# Patient Record
Sex: Female | Born: 1960 | Race: White | Hispanic: No | Marital: Married | Smoking: Never smoker
Health system: Southern US, Community
[De-identification: ages and names within clinical notes are randomized; demographics above are authoritative.]

## PROBLEM LIST (undated history)

## (undated) DIAGNOSIS — E0501 Thyrotoxicosis with diffuse goiter with thyrotoxic crisis or storm: Secondary | ICD-10-CM

## (undated) DIAGNOSIS — K219 Gastro-esophageal reflux disease without esophagitis: Secondary | ICD-10-CM

## (undated) DIAGNOSIS — I519 Heart disease, unspecified: Secondary | ICD-10-CM

## (undated) DIAGNOSIS — I499 Cardiac arrhythmia, unspecified: Secondary | ICD-10-CM

## (undated) DIAGNOSIS — E0500 Thyrotoxicosis with diffuse goiter without thyrotoxic crisis or storm: Secondary | ICD-10-CM

## (undated) DIAGNOSIS — I4819 Other persistent atrial fibrillation: Secondary | ICD-10-CM

## (undated) DIAGNOSIS — E039 Hypothyroidism, unspecified: Secondary | ICD-10-CM

## (undated) DIAGNOSIS — Z8679 Personal history of other diseases of the circulatory system: Secondary | ICD-10-CM

## (undated) HISTORY — DX: Hypothyroidism, unspecified: E03.9

## (undated) HISTORY — DX: Gastro-esophageal reflux disease without esophagitis: K21.9

## (undated) HISTORY — DX: Other persistent atrial fibrillation: I48.19

## (undated) HISTORY — DX: Thyrotoxicosis with diffuse goiter without thyrotoxic crisis or storm: E05.00

## (undated) HISTORY — PX: NO PAST SURGERIES: SHX2092

## (undated) HISTORY — DX: Heart disease, unspecified: I51.9

## (undated) HISTORY — DX: Personal history of other diseases of the circulatory system: Z86.79

## (undated) HISTORY — DX: Thyrotoxicosis with diffuse goiter with thyrotoxic crisis or storm: E05.01

---

## 2014-10-01 ENCOUNTER — Other Ambulatory Visit (HOSPITAL_COMMUNITY): Payer: Self-pay | Admitting: "Endocrinology

## 2014-10-01 DIAGNOSIS — E0590 Thyrotoxicosis, unspecified without thyrotoxic crisis or storm: Secondary | ICD-10-CM

## 2014-10-07 ENCOUNTER — Encounter (HOSPITAL_COMMUNITY): Payer: Self-pay

## 2014-10-07 ENCOUNTER — Encounter (HOSPITAL_COMMUNITY)
Admission: RE | Admit: 2014-10-07 | Discharge: 2014-10-07 | Disposition: A | Discharge status: Home / Self Care | Payer: BLUE CROSS/BLUE SHIELD | Source: Ambulatory Visit | Attending: "Endocrinology | Admitting: "Endocrinology

## 2014-10-07 DIAGNOSIS — E0590 Thyrotoxicosis, unspecified without thyrotoxic crisis or storm: Secondary | ICD-10-CM | POA: Insufficient documentation

## 2014-10-07 MED ORDER — SODIUM IODIDE I 131 CAPSULE
12.0000 | Freq: Once | INTRAVENOUS | Status: AC | PRN
  Administered 2014-10-07: 12 via ORAL

## 2014-10-08 ENCOUNTER — Encounter (HOSPITAL_COMMUNITY)
Admission: RE | Admit: 2014-10-08 | Discharge: 2014-10-08 | Disposition: A | Discharge status: Home / Self Care | Payer: BLUE CROSS/BLUE SHIELD | Source: Ambulatory Visit | Attending: "Endocrinology | Admitting: "Endocrinology

## 2014-10-08 DIAGNOSIS — E0590 Thyrotoxicosis, unspecified without thyrotoxic crisis or storm: Secondary | ICD-10-CM | POA: Diagnosis not present

## 2014-10-08 MED ORDER — SODIUM PERTECHNETATE TC 99M INJECTION
10.0000 | Freq: Once | INTRAVENOUS | Status: AC | PRN
  Administered 2014-10-08: 10 via INTRAVENOUS

## 2014-10-20 ENCOUNTER — Other Ambulatory Visit (HOSPITAL_COMMUNITY): Payer: Self-pay | Admitting: "Endocrinology

## 2014-10-20 DIAGNOSIS — E0590 Thyrotoxicosis, unspecified without thyrotoxic crisis or storm: Secondary | ICD-10-CM

## 2014-11-17 ENCOUNTER — Encounter (HOSPITAL_COMMUNITY): Payer: Self-pay

## 2014-11-17 ENCOUNTER — Encounter (HOSPITAL_COMMUNITY)
Admission: RE | Admit: 2014-11-17 | Discharge: 2014-11-17 | Disposition: A | Discharge status: Home / Self Care | Payer: BLUE CROSS/BLUE SHIELD | Source: Ambulatory Visit | Attending: "Endocrinology | Admitting: "Endocrinology

## 2014-11-17 DIAGNOSIS — E0590 Thyrotoxicosis, unspecified without thyrotoxic crisis or storm: Secondary | ICD-10-CM | POA: Insufficient documentation

## 2014-11-17 MED ORDER — SODIUM IODIDE I 131 CAPSULE
15.0000 | Freq: Once | INTRAVENOUS | Status: AC | PRN
  Administered 2014-11-17: 15.3 via ORAL

## 2015-02-24 ENCOUNTER — Ambulatory Visit: Payer: Self-pay | Admitting: "Endocrinology

## 2015-02-25 ENCOUNTER — Encounter: Payer: Self-pay | Admitting: "Endocrinology

## 2015-02-25 ENCOUNTER — Ambulatory Visit (INDEPENDENT_AMBULATORY_CARE_PROVIDER_SITE_OTHER): Payer: BLUE CROSS/BLUE SHIELD | Admitting: "Endocrinology

## 2015-02-25 VITALS — BP 124/85 | HR 71 | Ht 64.0 in | Wt 174.0 lb

## 2015-02-25 DIAGNOSIS — E890 Postprocedural hypothyroidism: Secondary | ICD-10-CM | POA: Insufficient documentation

## 2015-02-25 DIAGNOSIS — E032 Hypothyroidism due to medicaments and other exogenous substances: Secondary | ICD-10-CM | POA: Diagnosis not present

## 2015-02-25 MED ORDER — LEVOTHYROXINE SODIUM 100 MCG PO TABS: 100.0000 ug | ORAL_TABLET | Freq: Every day | ORAL | Status: DC

## 2015-02-25 NOTE — Progress Notes (Signed)
HPI  Bethany Blankenship is a 54 y.o.-year-old female, she is here to follow-up after treatment for Graves' disease with RAI . She is not on thyroid hormone replacement yet.  Pt describes: Weight gain, fatigue, cold intolerance, constipation, and dry skin.  Her labs on 02/17/2015 showed TSH elevated at 46 free T4 depressed at 0.4.  ROS: Constitutional: + weight gain,  +fatigue, +, hypothermia Eyes: no blurry vision, no xerophthalmia ENT: no sore throat, no nodules palpated in throat, no dysphagia/odynophagia, no hoarseness Cardiovascular: no CP/SOB/palpitations/leg swelling Respiratory: no cough/SOB Gastrointestinal: no N/V/D/, +constipation. Musculoskeletal: no muscle/joint aches Skin: no rashes Neurological: no tremors/numbness/tingling/dizziness Psychiatric: no depression/anxiety  PE: BP 124/85 mmHg  Pulse 71  Ht 5\' 4"  (1.626 m)  Wt 174 lb (78.926 kg)  BMI 29.85 kg/m2  SpO2 100% Wt Readings from Last 3 Encounters:  02/25/15 174 lb (78.926 kg)   Constitutional: overweight, in NAD Eyes: PERRLA, EOMI, no exophthalmos ENT: moist mucous membranes, no thyromegaly, no cervical lymphadenopathy Cardiovascular: RRR, No MRG Respiratory: CTA B Gastrointestinal: abdomen soft, NT, ND, BS+ Musculoskeletal: no deformities, strength intact in all 4 Skin: moist, warm, no rashes Neurological: no tremor with outstretched hands, DTR normal in all 4  ASSESSMENT: 1. 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 Her labs are consistent with profound hypothyroidism ready for initiation of thyroid hormone replacement. I would initiate levothyroxine 100 g by mouth every morning, to advance as tolerated. - We discussed about correct intake of levothyroxine, at fasting, with water, separated by at least 30 minutes from breakfast, and separated by more than 4 hours from calcium, iron, multivitamins, acid reflux medications  (PPIs). -Patient is made aware of the fact that thyroid hormone replacement is needed for life, dose to be adjusted by periodic monitoring of thyroid function tests. - Will check thyroid tests before next visit: TSH, free T4   Glade Lloyd, MD Phone: [phone]  Fax: [phone]   02/25/2015, 11:14 AM

## 2015-05-18 ENCOUNTER — Other Ambulatory Visit: Payer: Self-pay | Admitting: "Endocrinology

## 2015-05-18 LAB — TSH: TSH: 0.545 u[IU]/mL (ref 0.350–4.500)

## 2015-05-18 LAB — T4, FREE: Free T4: 1.58 ng/dL (ref 0.80–1.80)

## 2015-05-31 ENCOUNTER — Ambulatory Visit (INDEPENDENT_AMBULATORY_CARE_PROVIDER_SITE_OTHER): Payer: BLUE CROSS/BLUE SHIELD | Admitting: "Endocrinology

## 2015-05-31 VITALS — BP 121/72 | HR 54 | Ht 64.0 in | Wt 176.0 lb

## 2015-05-31 DIAGNOSIS — E032 Hypothyroidism due to medicaments and other exogenous substances: Secondary | ICD-10-CM | POA: Diagnosis not present

## 2015-05-31 MED ORDER — LEVOTHYROXINE SODIUM 100 MCG PO TABS: 100.0000 ug | ORAL_TABLET | Freq: Every day | ORAL | Status: DC

## 2015-05-31 NOTE — Progress Notes (Signed)
HPI  Bethany Blankenship is a 55 y.o.-year-old female, she is here to follow-up after treatment for Graves' disease with RAI . She is on thyroid hormone replacement.  Pt denies new complaints. He feels better.    ROS: Constitutional: - weight gain,  -fatigue, - hypothermia Eyes: no blurry vision, no xerophthalmia ENT: no sore throat, no nodules palpated in throat, no dysphagia/odynophagia, no hoarseness Cardiovascular: no CP/SOB/palpitations/leg swelling Respiratory: no cough/SOB Gastrointestinal: no N/V/D/, +constipation. Musculoskeletal: no muscle/joint aches Skin: no rashes Neurological: no tremors/numbness/tingling/dizziness Psychiatric: no depression/anxiety  PE: BP 121/72 mmHg  Pulse 54  Ht 5\' 4"  (1.626 m)  Wt 176 lb (79.833 kg)  BMI 30.20 kg/m2  SpO2 98% Wt Readings from Last 3 Encounters:  05/31/15 176 lb (79.833 kg)  02/25/15 174 lb (78.926 kg)   Constitutional: overweight, in NAD Eyes: PERRLA, EOMI, no exophthalmos ENT: moist mucous membranes, no thyromegaly, no cervical lymphadenopathy Cardiovascular: RRR, No MRG Respiratory: CTA B Gastrointestinal: abdomen soft, NT, ND, BS+ Musculoskeletal: no deformities, strength intact in all 4 Skin: moist, warm, no rashes Neurological: no tremor with outstretched hands, DTR normal in all 4  ASSESSMENT: 1. 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 Her labs are consistent with  appropriate replacement with thyroid hormone. I will continue with levothyroxine 100 g by mouth every morning.  - We discussed about correct intake of levothyroxine, at fasting, with water, separated by at least 30 minutes from breakfast, and separated by more than 4 hours from calcium, iron, multivitamins, acid reflux medications (PPIs). -Patient is made aware of the fact that thyroid hormone replacement is needed for life, dose to be adjusted by periodic monitoring of  thyroid function tests. - Will check thyroid tests before next visit: TSH, free T4   Glade Lloyd, MD Phone: [phone]  Fax: [phone]   05/31/2015, 3:47 PM

## 2015-10-29 IMAGING — NM NM THYROID IMAGING W/ UPTAKE SINGLE (24 HR)
4 series · 4 of 4 positions shown · non-contrast
Comparison: None

CLINICAL DATA: Symptoms of hyperthyroidism.

EXAM:
THYROID SCAN AND UPTAKE - 24 HOURS
TECHNIQUE: Following the per oral administration of Q-I9I sodium iodide, the
patient returned at 24 hours and uptake measurements were acquired
with the uptake probe centered on the neck. Thyroid imaging was
performed following the intravenous administration of the Dc-55m
Pertechnetate.
RADIOPHARMACEUTICALS:  12.0 microCuries Q-I9I sodium iodide orally
and 10.0 mCi 0echnetium-UUm pertechnetate IV

[Series 1: ant w marker · 3.25mm/px · 1 of 1 slices shown]
[im 1/1]
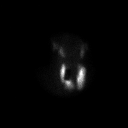

[Series 2: anterior · 3.25mm/px · 1 of 1 slices shown]
[im 1/1]
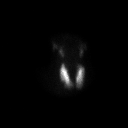

[Series 3: lao · 3.25mm/px · 1 of 1 slices shown]
[im 1/1]
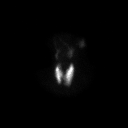

[Series 4: rao · 3.25mm/px · 1 of 1 slices shown]
[im 1/1]
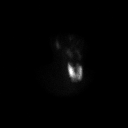

[4 of 4 positions shown; findings below may reference images not displayed]

FINDINGS: The thyroid scan shows diffuse homogeneous uptake throughout both
lobes of the thyroid gland. No dominant hot or cold nodules
identified. The 24 hr radioactive iodine uptake is equal to 33%.
IMPRESSION: 1. Mildly elevated 24 hr radioactive iodine uptake equal to 33%.
2. Normal thyroid scan. In a patient who is clinically hyperthyroid
findings may reflect early Grave's disease.

## 2015-12-01 ENCOUNTER — Other Ambulatory Visit: Payer: Self-pay | Admitting: "Endocrinology

## 2015-12-01 LAB — TSH: TSH: 3.08 m[IU]/L

## 2015-12-01 LAB — T4, FREE: Free T4: 1.8 ng/dL (ref 0.8–1.8)

## 2015-12-02 ENCOUNTER — Ambulatory Visit: Payer: Self-pay | Admitting: "Endocrinology

## 2015-12-08 ENCOUNTER — Ambulatory Visit (INDEPENDENT_AMBULATORY_CARE_PROVIDER_SITE_OTHER): Payer: BLUE CROSS/BLUE SHIELD | Admitting: "Endocrinology

## 2015-12-08 ENCOUNTER — Encounter: Payer: Self-pay | Admitting: "Endocrinology

## 2015-12-08 VITALS — BP 116/78 | HR 78 | Resp 18 | Ht 64.0 in | Wt 178.0 lb

## 2015-12-08 DIAGNOSIS — E890 Postprocedural hypothyroidism: Secondary | ICD-10-CM | POA: Diagnosis not present

## 2015-12-08 MED ORDER — LEVOTHYROXINE SODIUM 100 MCG PO TABS: 100.0000 ug | ORAL_TABLET | Freq: Every day | ORAL | 6 refills | Status: DC

## 2015-12-08 NOTE — Progress Notes (Signed)
HPI  Bethany Blankenship is a 55 y.o.-year-old female, she is here to follow-up after treatment for Graves' disease with RAI . She is on thyroid hormone replacement.  Bethany Blankenship denies new complaints. He feels better.    ROS: Constitutional: - weight gain,  -fatigue, - hypothermia Eyes: no blurry vision, no xerophthalmia ENT: no sore throat, no nodules palpated in throat, no dysphagia/odynophagia, no hoarseness Cardiovascular: no CP/SOB/palpitations/leg swelling Respiratory: no cough/SOB Gastrointestinal: no N/V/D/, +constipation. Musculoskeletal: no muscle/joint aches Skin: no rashes Neurological: no tremors/numbness/tingling/dizziness Psychiatric: no depression/anxiety  PE: BP 116/78 (BP Location: Right Arm, Patient Position: Sitting, Cuff Size: Normal)   Pulse 78   Resp 18   Ht 5\' 4"  (1.626 m)   Wt 178 lb (80.7 kg)   SpO2 100%   BMI 30.55 kg/m  Wt Readings from Last 3 Encounters:  12/08/15 178 lb (80.7 kg)  05/31/15 176 lb (79.8 kg)  02/25/15 174 lb (78.9 kg)   Constitutional: overweight, in NAD Eyes: PERRLA, EOMI, no exophthalmos ENT: moist mucous membranes, no thyromegaly, no cervical lymphadenopathy Cardiovascular: RRR, No MRG Respiratory: CTA B Gastrointestinal: abdomen soft, NT, ND, BS+ Musculoskeletal: no deformities, strength intact in all 4 Skin: moist, warm, no rashes Neurological: no tremor with outstretched hands, DTR normal in all 4  ASSESSMENT: 1. 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 I will continue with levothyroxine 100 g by mouth every morning.  - We discussed about correct intake of levothyroxine, at fasting, with water, separated by at least 30 minutes from breakfast, and separated by more than 4 hours from calcium, iron, multivitamins, acid reflux medications (PPIs). -Patient is made aware of the fact that thyroid hormone replacement is needed for life, dose to be adjusted by periodic  monitoring of thyroid function tests. - Will check thyroid tests before next visit: TSH, free T4   Glade Lloyd, MD Phone: [phone]  Fax: [phone]   12/08/2015, 10:36 AM

## 2016-06-14 ENCOUNTER — Other Ambulatory Visit: Payer: Self-pay | Admitting: "Endocrinology

## 2016-06-14 ENCOUNTER — Ambulatory Visit: Payer: Self-pay | Admitting: "Endocrinology

## 2016-06-14 DIAGNOSIS — E039 Hypothyroidism, unspecified: Secondary | ICD-10-CM

## 2016-06-15 LAB — TSH: TSH: 4.65 mIU/L — ABNORMAL HIGH

## 2016-06-15 LAB — T4, FREE: FREE T4: 1.5 ng/dL (ref 0.8–1.8)

## 2016-06-23 ENCOUNTER — Encounter: Payer: Self-pay | Admitting: "Endocrinology

## 2016-06-23 ENCOUNTER — Ambulatory Visit (INDEPENDENT_AMBULATORY_CARE_PROVIDER_SITE_OTHER): Payer: BLUE CROSS/BLUE SHIELD | Admitting: "Endocrinology

## 2016-06-23 VITALS — BP 103/73 | HR 92 | Ht 64.0 in | Wt 176.0 lb

## 2016-06-23 DIAGNOSIS — E890 Postprocedural hypothyroidism: Secondary | ICD-10-CM | POA: Diagnosis not present

## 2016-06-23 MED ORDER — LEVOTHYROXINE SODIUM 112 MCG PO TABS: 112.0000 ug | ORAL_TABLET | Freq: Every day | ORAL | 6 refills | Status: DC

## 2016-06-23 NOTE — Progress Notes (Signed)
HPI  Bethany Blankenship is a 56 y.o.-year-old female, she is here to follow-up after treatment for Graves' disease with RAI . She is on thyroid hormone replacement.  Pt reports cold intolerance and fatigue.   ROS: Constitutional: - weight gain,  -fatigue, - hypothermia Eyes: no blurry vision, no xerophthalmia ENT: no sore throat, no nodules palpated in throat, no dysphagia/odynophagia, no hoarseness Cardiovascular: no CP/SOB/palpitations/leg swelling Respiratory: no cough/SOB Gastrointestinal: no N/V/D/, +constipation. Musculoskeletal: no muscle/joint aches Skin: no rashes Neurological: no tremors/numbness/tingling/dizziness Psychiatric: no depression/anxiety  PE: BP 103/73   Pulse 92   Ht 5\' 4"  (1.626 m)   Wt 176 lb (79.8 kg)   BMI 30.21 kg/m  Wt Readings from Last 3 Encounters:  06/23/16 176 lb (79.8 kg)  12/08/15 178 lb (80.7 kg)  05/31/15 176 lb (79.8 kg)   Constitutional: overweight, in NAD Eyes: PERRLA, EOMI, no exophthalmos ENT: moist mucous membranes, no thyromegaly, no cervical lymphadenopathy Cardiovascular: RRR, No MRG Respiratory: CTA B Gastrointestinal: abdomen soft, NT, ND, BS+ Musculoskeletal: no deformities, strength intact in all 4 Skin: moist, warm, no rashes Neurological: no tremor with outstretched hands, DTR normal in all 4   Recent Results (from the past 2160 hour(s))  TSH     Status: Abnormal   Collection Time: 06/14/16 11:00 AM  Result Value Ref Range   TSH 4.65 (H) mIU/L    Comment:   Reference Range   > or = 20 Years  0.40-4.50   Pregnancy Range First trimester  0.26-2.66 Second trimester 0.55-2.73 Third trimester  0.43-2.91     T4, Free     Status: None   Collection Time: 06/14/16 12:35 PM  Result Value Ref Range   Free T4 1.5 0.8 - 1.8 ng/dL    ASSESSMENT: 1. 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 I will Increase levothyroxine  to 112 g by mouth every morning.   - We discussed about correct intake of levothyroxine, at fasting, with water, separated by at least 30 minutes from breakfast, and separated by more than 4 hours from calcium, iron, multivitamins, acid reflux medications (PPIs). -Patient is made aware of the fact that thyroid hormone replacement is needed for life, dose to be adjusted by periodic monitoring of thyroid function tests. - Will check thyroid tests before next visit: TSH, free T4   Glade Lloyd, MD Phone: [phone]  Fax: [phone]   06/23/2016, 10:01 AM

## 2016-07-09 ENCOUNTER — Other Ambulatory Visit: Payer: Self-pay | Admitting: "Endocrinology

## 2016-07-10 MED ORDER — LEVOTHYROXINE SODIUM 112 MCG PO TABS: 112.0000 ug | ORAL_TABLET | Freq: Every day | ORAL | 6 refills | Status: DC

## 2016-09-01 ENCOUNTER — Encounter: Payer: Self-pay | Admitting: *Deleted

## 2016-09-01 ENCOUNTER — Encounter: Payer: Self-pay | Admitting: Cardiovascular Disease

## 2016-09-01 ENCOUNTER — Ambulatory Visit (INDEPENDENT_AMBULATORY_CARE_PROVIDER_SITE_OTHER): Payer: BLUE CROSS/BLUE SHIELD | Admitting: Cardiovascular Disease

## 2016-09-01 VITALS — BP 149/86 | HR 47 | Ht 65.0 in | Wt 175.0 lb

## 2016-09-01 DIAGNOSIS — R030 Elevated blood-pressure reading, without diagnosis of hypertension: Secondary | ICD-10-CM

## 2016-09-01 DIAGNOSIS — Z79899 Other long term (current) drug therapy: Secondary | ICD-10-CM | POA: Diagnosis not present

## 2016-09-01 DIAGNOSIS — R002 Palpitations: Secondary | ICD-10-CM

## 2016-09-01 DIAGNOSIS — R001 Bradycardia, unspecified: Secondary | ICD-10-CM | POA: Diagnosis not present

## 2016-09-01 DIAGNOSIS — I480 Paroxysmal atrial fibrillation: Secondary | ICD-10-CM | POA: Diagnosis not present

## 2016-09-01 MED ORDER — PROPAFENONE HCL 225 MG PO TABS: 225.0000 mg | ORAL_TABLET | Freq: Three times a day (TID) | ORAL | 0 refills | Status: DC

## 2016-09-01 MED ORDER — METOPROLOL SUCCINATE ER 25 MG PO TB24: 25.0000 mg | ORAL_TABLET | Freq: Every day | ORAL | 1 refills | Status: DC

## 2016-09-01 NOTE — Patient Instructions (Signed)
Your physician recommends that you schedule a follow-up appointment in: [place] physician has recommended you make the following change in your medication:   DECREASE TOPROL XL 25 MG DAILY  MONITOR YOUR BLOOD PRESSURE BEFORE TAKING TOPROL XL   Thank you for choosing [facility]!!

## 2016-09-01 NOTE — Progress Notes (Signed)
CARDIOLOGY CONSULT NOTE  Patient ID: Bethany Blankenship MRN: [id number] DOB/AGE: 10/19/1960 56 y.o.  Admit date: (Not on file) Primary Physician: Manon Hilding, MD Referring Physician: Quintin Alto  Reason for Consultation: atrial fibrillation  HPI: Bethany Blankenship is a 56 y.o. female who is being seen today for the evaluation of atrial fibrillation at the request of Bethany Masse, MD.  She is a 56 year old woman with a history of paroxysmal atrial fibrillation and currently takes ASA, Toprol-XL, and propafenone. She was previously evaluated by [facility] in June 2017. Apparently a discussion regarding ablation was had, and she preferred medical therapy. She underwent 2 cardioversions in 2017. She ultimately underwent a thyroid ablation in July 2017.  I personally reviewed several ECGs performed in 2017. Some shows sinus rhythm and some showed rapid atrial fibrillation.  I personally reviewed office documentation from 2017. She had an echocardiogram in 2016 but I do not have the report and will order.  Last Friday she had about 15 minutes of palpitations. On the following Sunday and Monday she had some lightheadedness but denies syncope and has no further issues with lightheadedness or dizziness.  She has not taken metoprolol succinate today and blood pressure is 149/86.  She denies chest pain, leg swelling, and shortness of breath.  ECG performed in the office today which I ordered and personally interpreted demonstrates sinus bradycardia, 47 bpm.     No Known Allergies  Current Outpatient Prescriptions  Medication Sig Dispense Refill  . aspirin EC 81 MG tablet Take 81 mg by mouth daily.    . fluticasone (FLONASE) 50 MCG/ACT nasal spray Place into both nostrils daily.    Marland Kitchen levothyroxine (SYNTHROID, LEVOTHROID) 112 MCG tablet Take 1 tablet (112 mcg total) by mouth daily before breakfast. 30 tablet 6  . metoprolol succinate (TOPROL-XL) 50 MG 24 hr tablet 50 mg.    . omeprazole  (PRILOSEC) 40 MG capsule Take 40 mg by mouth daily.    . propafenone (RYTHMOL) 225 MG tablet Take 225 mg by mouth every 8 (eight) hours.     No current facility-administered medications for this visit.     Past Medical History:  Diagnosis Date  . 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  Social History   Social History  . Marital status: Married    Spouse name: N/A  . Number of children: N/A  . Years of education: N/A   Occupational History  . Not on file.   Social History Main Topics  . Smoking status: Never Smoker  . Smokeless tobacco: Never Used  . Alcohol use No  . Drug use: No  . Sexual activity: Not on file   Other Topics Concern  . Not on file   Social History Narrative  . No narrative on file     No family history of premature CAD in 1st degree relatives.  Current Meds  Medication Sig  . aspirin EC 81 MG tablet Take 81 mg by mouth daily.  . fluticasone (FLONASE) 50 MCG/ACT nasal spray Place into both nostrils daily.  Marland Kitchen levothyroxine (SYNTHROID, LEVOTHROID) 112 MCG tablet Take 1 tablet (112 mcg total) by mouth daily before breakfast.  . metoprolol succinate (TOPROL-XL) 50 MG 24 hr tablet 50 mg.  . omeprazole (PRILOSEC) 40 MG capsule Take 40 mg by mouth daily.  . propafenone (RYTHMOL) 225 MG tablet Take 225 mg by mouth every 8 (eight) hours.  Review of systems complete and found to be negative unless listed above in HPI    Physical exam Blood pressure (!) 149/86, pulse (!) 47, height 5\' 5"  (1.651 m), weight 175 lb (79.4 kg), SpO2 98 %. General: NAD Neck: No JVD, no thyromegaly or thyroid nodule.  Lungs: Clear to auscultation bilaterally with normal respiratory effort. CV: Nondisplaced PMI. Bradycardic, regular rhythm, normal S1/S2, no S3/S4, no murmur.  No peripheral edema.  No carotid bruit.    Abdomen: Soft, nontender, no distention.  Skin: Intact without lesions or rashes.    Neurologic: Alert and oriented x 3.  Psych: Normal affect. Extremities: No clubbing or cyanosis.  HEENT: Normal.   ECG: Most recent ECG reviewed.   Labs: Lab Results  Component Value Date/Time   TSH 4.65 (H) 06/14/2016 11:00 AM     Lipids: No results found for: LDLCALC, LDLDIRECT, CHOL, TRIG, HDL      ASSESSMENT AND PLAN:  1. Paroxysmal atrial fibrillation: She is currently in sinus bradycardia. She has occasional palpitations. I will reduce Toprol-XL to 25 mg daily. I will obtain a copy of the echocardiogram report dated 2016. For the time being I will continue propafenone. CHADSVASC score currently 1 but may be 2. I will have her check her blood pressure before taking Toprol-XL to see if she indeed has essential hypertension. This would increase her score to 2 and anticoagulation would be indicated to reduce thromboembolic risk. That being said, it is quite possible that her overactive thyroid was the etiology of her atrial fibrillation and she has since undergone thyroid ablation.  2. Elevated BP: I have asked her to check BP before taking Toprol-XL for the next several months a few times per week. It is currently elevated.  Disposition: Follow up in 3 months  Signed: Kate Sable, M.D., F.A.C.C.  09/01/2016, 9:46 AM

## 2016-12-05 ENCOUNTER — Other Ambulatory Visit: Payer: Self-pay | Admitting: Cardiovascular Disease

## 2016-12-12 ENCOUNTER — Encounter: Payer: Self-pay | Admitting: Cardiovascular Disease

## 2016-12-12 ENCOUNTER — Ambulatory Visit (INDEPENDENT_AMBULATORY_CARE_PROVIDER_SITE_OTHER): Payer: Commercial Managed Care - PPO | Admitting: Cardiovascular Disease

## 2016-12-12 VITALS — BP 138/82 | HR 50 | Ht 65.0 in | Wt 172.0 lb

## 2016-12-12 DIAGNOSIS — R030 Elevated blood-pressure reading, without diagnosis of hypertension: Secondary | ICD-10-CM | POA: Diagnosis not present

## 2016-12-12 DIAGNOSIS — I480 Paroxysmal atrial fibrillation: Secondary | ICD-10-CM | POA: Diagnosis not present

## 2016-12-12 NOTE — Patient Instructions (Signed)

## 2016-12-12 NOTE — Progress Notes (Signed)
SUBJECTIVE: The patient presents for follow-up of paroxysmal atrial fibrillation.  The patient denies any symptoms of chest pain, palpitations, shortness of breath, lightheadedness, dizziness, leg swelling, orthopnea, PND, and syncope.  She stays busy taking care of her nearly 58 yr old granddaughter.  I reviewed her blood pressure log and readings have essentially stayed normal other than 2 isolated elevations on 2 separate days. This occurred several weeks ago.   Review of Systems: As per "subjective", otherwise negative.  No Known Allergies  Current Outpatient Prescriptions  Medication Sig Dispense Refill  . aspirin EC 81 MG tablet Take 81 mg by mouth daily.    . fluticasone (FLONASE) 50 MCG/ACT nasal spray Place into both nostrils as needed.     Marland Kitchen levothyroxine (SYNTHROID, LEVOTHROID) 112 MCG tablet Take 1 tablet (112 mcg total) by mouth daily before breakfast. 30 tablet 6  . metoprolol succinate (TOPROL XL) 25 MG 24 hr tablet Take 1 tablet (25 mg total) by mouth daily. 90 tablet 1  . omeprazole (PRILOSEC) 40 MG capsule Take 40 mg by mouth daily.    . propafenone (RYTHMOL) 225 MG tablet TAKE ONE TABLET BY MOUTH EVERY 8 HOURS 270 tablet 1   No current facility-administered medications for this visit.     Past Medical History:  Diagnosis Date  . 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  Social History   Social History  . Marital status: Married    Spouse name: N/A  . Number of children: N/A  . Years of education: N/A   Occupational History  . Not on file.   Social History Main Topics  . Smoking status: Never Smoker  . Smokeless tobacco: Never Used  . Alcohol use No  . Drug use: No  . Sexual activity: Not on file   Other Topics Concern  . Not on file   Social History Narrative  . No narrative on file     Vitals:   12/12/16 0950  BP: 138/82  Pulse: (!) 50  SpO2: 99%  Weight: 172 lb (78  kg)  Height: 5\' 5"  (1.651 m)    Wt Readings from Last 3 Encounters:  12/12/16 172 lb (78 kg)  09/01/16 175 lb (79.4 kg)  06/23/16 176 lb (79.8 kg)     PHYSICAL EXAM General: NAD HEENT: Normal. Neck: No JVD, no thyromegaly. Lungs: Clear to auscultation bilaterally with normal respiratory effort. CV: Nondisplaced PMI.  Regular rate and rhythm, normal S1/S2, no S3/S4, no murmur. No pretibial or periankle edema.  No carotid bruit.   Abdomen: Soft, nontender, no distention.  Neurologic: Alert and oriented.  Psych: Normal affect. Skin: Normal. Musculoskeletal: No gross deformities.    ECG: Most recent ECG reviewed.   Labs: Lab Results  Component Value Date/Time   TSH 4.65 (H) 06/14/2016 11:00 AM     Lipids: No results found for: LDLCALC, LDLDIRECT, CHOL, TRIG, HDL     ASSESSMENT AND PLAN: 1. Paroxysmal atrial fibrillation: Symptomatically stable on Toprol-XL and propafenone. No changes to therapy. Anticoagulation is not indicated at this time given CHADSVASC score score of 1.  2. Elevated blood pressure: I reviewed her blood pressure log which showed essentially normal readings. I defined both stage I and stage II hypertension for her. I asked her to periodically check her blood pressures so that this can be monitored.      Disposition: Follow up 1 year   Kate Sable, M.D.,  F.A.C.C. 

## 2016-12-15 ENCOUNTER — Other Ambulatory Visit: Payer: Self-pay | Admitting: "Endocrinology

## 2016-12-16 LAB — T4, FREE: FREE T4: 1.6 ng/dL (ref 0.8–1.8)

## 2016-12-16 LAB — TSH: TSH: 3.49 mIU/L

## 2016-12-22 ENCOUNTER — Encounter: Payer: Self-pay | Admitting: "Endocrinology

## 2016-12-22 ENCOUNTER — Ambulatory Visit (INDEPENDENT_AMBULATORY_CARE_PROVIDER_SITE_OTHER): Payer: Commercial Managed Care - PPO | Admitting: "Endocrinology

## 2016-12-22 VITALS — BP 137/87 | HR 48 | Wt 169.0 lb

## 2016-12-22 DIAGNOSIS — E890 Postprocedural hypothyroidism: Secondary | ICD-10-CM

## 2016-12-22 MED ORDER — LEVOTHYROXINE SODIUM 112 MCG PO TABS: 112.0000 ug | ORAL_TABLET | Freq: Every day | ORAL | 6 refills | Status: DC

## 2016-12-22 NOTE — Progress Notes (Signed)
HPI  Bethany Blankenship is a 56 y.o.-year-old female, she is here to follow-up after treatment for Graves' disease with RAI on 11/17/2014. She is on thyroid hormone replacement, currently on levothyroxine 112 g by mouth every morning. Pt reports cold intolerance and fatigue. She has no new complaints.   ROS: Constitutional: +  weight  loss of 10 pounds since last year,  + fatigue, - hypothermia Eyes: no blurry vision, no xerophthalmia ENT: no sore throat, no nodules palpated in throat, no dysphagia/odynophagia, no hoarseness Cardiovascular: no CP/SOB/palpitations/leg swelling Respiratory: no cough/SOB Gastrointestinal: no N/V/D/, +constipation. Musculoskeletal: no muscle/joint aches Skin: no rashes Neurological: no tremors/numbness/tingling/dizziness Psychiatric: no depression/anxiety  PE: BP 137/87   Pulse (!) 48 Comment: A-Fib  Wt 169 lb (76.7 kg)   SpO2 100%   BMI 28.12 kg/m  Wt Readings from Last 3 Encounters:  12/22/16 169 lb (76.7 kg)  12/12/16 172 lb (78 kg)  09/01/16 175 lb (79.4 kg)   Constitutional: overweight, in NAD Eyes: PERRLA, EOMI, no exophthalmos ENT: moist mucous membranes, no thyromegaly, no cervical lymphadenopathy Cardiovascular: + Bradycardic at 48 , No MRG Respiratory: CTA B Gastrointestinal: abdomen soft, NT, ND, BS+ Musculoskeletal: no deformities, strength intact in all 4 Skin: moist, warm, no rashes Neurological: no tremor with outstretched hands, DTR normal in all 4   Recent Results (from the past 2160 hour(s))  TSH     Status: None   Collection Time: 12/15/16 11:08 AM  Result Value Ref Range   TSH 3.49 mIU/L    Comment:   Reference Range   > or = 20 Years  0.40-4.50   Pregnancy Range First trimester  0.26-2.66 Second trimester 0.55-2.73 Third trimester  0.43-2.91     T4, free     Status: None   Collection Time: 12/15/16 11:08 AM  Result Value Ref Range   Free T4 1.6 0.8 - 1.8 ng/dL    ASSESSMENT: 1. Hypothyroidism induced by  RAI  PLAN:   Her labs are consistent with appropriate  replacement with thyroid hormone. I will continue  levothyroxine   112 g by mouth every morning.  - We discussed about correct intake of levothyroxine, at fasting, with water, separated by at least 30 minutes from breakfast, and separated by more than 4 hours from calcium, iron, multivitamins, acid reflux medications (PPIs). -Patient is made aware of the fact that thyroid hormone replacement is needed for life, dose to be adjusted by periodic monitoring of thyroid function tests. - Will check thyroid tests before next visit in 6 month: TSH, free T4  - Given her bradycardia, I advised her to discontinue metoprolol.   Glade Lloyd, MD Phone: [phone]  Fax: [phone]   12/22/2016, 10:27 AM

## 2017-04-26 ENCOUNTER — Other Ambulatory Visit: Payer: Self-pay | Admitting: Cardiovascular Disease

## 2017-04-30 ENCOUNTER — Other Ambulatory Visit: Payer: Self-pay | Admitting: Cardiovascular Disease

## 2017-04-30 ENCOUNTER — Telehealth: Payer: Self-pay

## 2017-04-30 NOTE — Telephone Encounter (Signed)
Patient states Dr. Bronson Ing had her taking toprol xl 25 mg. When she seen Dr. Dorris Fetch her HR was low and he told her to discontinue it. Patient feels she really needs this medication and is wanting Dr. Bronson Ing to refill it. Patient states her HR has been in the 60's

## 2017-05-01 ENCOUNTER — Other Ambulatory Visit: Payer: Self-pay | Admitting: Cardiovascular Disease

## 2017-05-01 NOTE — Telephone Encounter (Signed)
I reviewed Dr. Liliane Channel note. She had asymptomatic sinus bradycardia. It is fine to hold metoprolol for now as she is on propafenone.

## 2017-05-02 NOTE — Telephone Encounter (Signed)
Patient notified and verbalized understanding. 

## 2017-05-21 ENCOUNTER — Other Ambulatory Visit: Payer: Self-pay | Admitting: Cardiovascular Disease

## 2017-05-30 ENCOUNTER — Other Ambulatory Visit: Payer: Self-pay | Admitting: Cardiovascular Disease

## 2017-06-04 LAB — T4, FREE: Free T4: 1.6 ng/dL (ref 0.8–1.8)

## 2017-06-04 LAB — TSH: TSH: 2.43 mIU/L (ref 0.40–4.50)

## 2017-06-14 ENCOUNTER — Telehealth: Payer: Self-pay | Admitting: Cardiovascular Disease

## 2017-06-14 NOTE — Telephone Encounter (Signed)
Does not check her BP, does not have machine.  Heart beat got up as high as 115.  Averaging about 90.  Stated that she did feel better with lower heart rate.

## 2017-06-14 NOTE — Telephone Encounter (Signed)
Thyroid MD stopped her Metoprolol initially - been off x 1 month or so. Now noticing heart rate steadily going back up for about a week now & noticing it beating faster.  Does notice some SOB when the HR is elevated.  No chest pain or dizziness.

## 2017-06-14 NOTE — Telephone Encounter (Signed)
Would like to know if she should start back on her medication.  She has been experiencing higher BP and other symptoms since stopping

## 2017-06-14 NOTE — Telephone Encounter (Signed)
What specifically have HR's and BP's been?

## 2017-06-15 MED ORDER — METOPROLOL SUCCINATE ER 25 MG PO TB24: 12.5000 mg | ORAL_TABLET | Freq: Every day | ORAL | 3 refills | Status: DC

## 2017-06-15 NOTE — Telephone Encounter (Signed)
Patient notified.  New prescription sent to [facility] now.

## 2017-06-15 NOTE — Telephone Encounter (Signed)
Can try half prior dose (Toprol XL 12.5 mg daily).

## 2017-06-27 ENCOUNTER — Ambulatory Visit: Payer: Self-pay | Admitting: "Endocrinology

## 2017-07-05 ENCOUNTER — Encounter: Payer: Self-pay | Admitting: "Endocrinology

## 2017-07-05 ENCOUNTER — Ambulatory Visit (INDEPENDENT_AMBULATORY_CARE_PROVIDER_SITE_OTHER): Payer: Commercial Managed Care - PPO | Admitting: "Endocrinology

## 2017-07-05 VITALS — BP 113/81 | HR 100 | Ht 65.0 in | Wt 173.0 lb

## 2017-07-05 DIAGNOSIS — E890 Postprocedural hypothyroidism: Secondary | ICD-10-CM

## 2017-07-05 MED ORDER — LEVOTHYROXINE SODIUM 112 MCG PO TABS: 112.0000 ug | ORAL_TABLET | Freq: Every day | ORAL | 4 refills | Status: DC

## 2017-07-05 NOTE — Progress Notes (Signed)
HPI  Bethany Blankenship is a 57 y.o.-year-old female, she is here to follow-up after treatment for Graves' disease with RAI on 11/17/2014. She is on thyroid hormone replacement, currently on levothyroxine 112 g by mouth every morning. - She has no new complaints. She has steady weight.   ROS: Constitutional: +  Steady weight. - fatigue, - hypothermia Eyes: no blurry vision, no xerophthalmia ENT: no sore throat, no nodules palpated in throat, no dysphagia/odynophagia, no hoarseness Cardiovascular: No chest pain, no shortness of breath, no palpitations.   Respiratory: no cough/SOB Gastrointestinal: no N/V/D/, +constipation. Musculoskeletal: no muscle/joint aches Skin: no rashes Neurological: no tremors/numbness/tingling/dizziness Psychiatric: no depression/anxiety  PE: BP 113/81   Pulse 100   Ht 5\' 5"  (1.651 m)   Wt 173 lb (78.5 kg)   BMI 28.79 kg/m  Wt Readings from Last 3 Encounters:  07/05/17 173 lb (78.5 kg)  12/22/16 169 lb (76.7 kg)  12/12/16 172 lb (78 kg)   Constitutional: overweight, in NAD Eyes: PERRLA, EOMI, no exophthalmos ENT: moist mucous membranes, no thyromegaly, no cervical lymphadenopathy Cardiovascular: RRR,  No MRG Respiratory: CTA B Gastrointestinal: abdomen soft, NT, ND, BS+ Musculoskeletal: no deformities, strength intact in all 4 Skin: moist, warm, no rashes Neurological: no tremor with outstretched hands, DTR  + normal on bilateral lower extremities.     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 I will continue  levothyroxine   112 g by mouth every morning.   - We discussed about  correct intake of levothyroxine, at fasting, with water, separated by at least 30 minutes from breakfast, and separated by more than 4 hours from calcium, iron, multivitamins, acid reflux medications (PPIs). -Patient is made aware of the fact that thyroid hormone replacement is needed for life, dose to be adjusted by periodic monitoring of thyroid function tests.   Glade Lloyd, MD Phone: [phone]  Fax: [phone]  -  This note was partially dictated with voice recognition software. Similar sounding words can be transcribed inadequately or may not  be corrected upon review.  07/05/2017, 1:39 PM

## 2017-11-18 ENCOUNTER — Other Ambulatory Visit: Payer: Self-pay | Admitting: Cardiovascular Disease

## 2017-12-19 ENCOUNTER — Encounter: Payer: Self-pay | Admitting: Cardiovascular Disease

## 2017-12-19 ENCOUNTER — Ambulatory Visit (INDEPENDENT_AMBULATORY_CARE_PROVIDER_SITE_OTHER): Payer: Commercial Managed Care - PPO | Admitting: Cardiovascular Disease

## 2017-12-19 ENCOUNTER — Other Ambulatory Visit: Payer: Self-pay

## 2017-12-19 VITALS — BP 134/84 | HR 52 | Ht 65.0 in | Wt 174.0 lb

## 2017-12-19 DIAGNOSIS — I480 Paroxysmal atrial fibrillation: Secondary | ICD-10-CM | POA: Diagnosis not present

## 2017-12-19 NOTE — Progress Notes (Signed)
SUBJECTIVE: The patient presents for follow-up of paroxysmal atrial fibrillation.  ECG performed in the office today which I ordered and personally interpreted demonstrates normal sinus bradycardia, 51 bpm, with late R wave transition and nonspecific IVCD.  The patient denies any symptoms of chest pain, palpitations, shortness of breath, lightheadedness, dizziness, leg swelling, orthopnea, PND, and syncope.  She has had some left foot issues and is going to see a podiatrist and may need an injection for tendinitis.  She and her husband stay busy taking care of their grandchildren 4 days/week.    Review of Systems: As per "subjective", otherwise negative.  No Known Allergies  Current Outpatient Medications  Medication Sig Dispense Refill  . aspirin EC 81 MG tablet Take 81 mg by mouth daily.    . fluticasone (FLONASE) 50 MCG/ACT nasal spray Place into both nostrils as needed.     Marland Kitchen levothyroxine (SYNTHROID, LEVOTHROID) 112 MCG tablet Take 1 tablet (112 mcg total) by mouth daily before breakfast. 90 tablet 4  . metoprolol succinate (TOPROL XL) 25 MG 24 hr tablet Take 0.5 tablets (12.5 mg total) by mouth daily. 45 tablet 3  . omeprazole (PRILOSEC) 40 MG capsule Take 40 mg by mouth daily.    . propafenone (RYTHMOL) 225 MG tablet TAKE ONE TABLET BY MOUTH EVERY 8 HOURS 270 tablet 0   No current facility-administered medications for this visit.     Past Medical History:  Diagnosis Date  . 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  Social History   Socioeconomic History  . Marital status: Married    Spouse name: Not on file  . Number of children: Not on file  . Years of education: Not on file  . Highest education level: Not on file  Occupational History  . Not on file  Social Needs  . Financial resource strain: Not on file  . Food insecurity:    Worry: Not on file    Inability: Not on file  . Transportation  needs:    Medical: Not on file    Non-medical: Not on file  Tobacco Use  . Smoking status: Never Smoker  . Smokeless tobacco: Never Used  Substance and Sexual Activity  . Alcohol use: No    Alcohol/week: 0.0 oz  . Drug use: No  . Sexual activity: Not on file  Lifestyle  . Physical activity:    Days per week: Not on file    Minutes per session: Not on file  . Stress: Not on file  Relationships  . Social connections:    Talks on phone: Not on file    Gets together: Not on file    Attends religious service: Not on file    Active member of club or organization: Not on file    Attends meetings of clubs or organizations: Not on file    Relationship status: Not on file  . Intimate partner violence:    Fear of current or ex partner: Not on file    Emotionally abused: Not on file    Physically abused: Not on file    Forced sexual activity: Not on file  Other Topics Concern  . Not on file  Social History Narrative  . Not on file     Vitals:   12/19/17 1053  BP: 134/84  Pulse: (!) 52  SpO2: 100%  Weight: 174 lb (78.9 kg)  Height: 5\' 5"  (1.651 m)  Wt Readings from Last 3 Encounters:  12/19/17 174 lb (78.9 kg)  07/05/17 173 lb (78.5 kg)  12/22/16 169 lb (76.7 kg)     PHYSICAL EXAM General: NAD HEENT: Normal. Neck: No JVD, no thyromegaly. Lungs: Clear to auscultation bilaterally with normal respiratory effort. CV: Bradycardic, regular rhythm, normal S1/S2, no S3/S4, no murmur. No pretibial or periankle edema.  No carotid bruit.   Abdomen: Soft, nontender, no distention.  Neurologic: Alert and oriented.  Psych: Normal affect. Skin: Normal. Musculoskeletal: No gross deformities.    ECG: Reviewed above under Subjective   Labs: Lab Results  Component Value Date/Time   TSH 2.43 06/04/2017 10:50 AM     Lipids: No results found for: LDLCALC, LDLDIRECT, CHOL, TRIG, HDL     ASSESSMENT AND PLAN: 1.  Paroxysmal atrial fibrillation: Symptomatically stable on  Toprol-XL 12.5 mg daily and Rythmol 225 mg every 8 hours.  No indication for anticoagulation as CHADSVASC score is 1.    Disposition: Follow up 1 year   Kate Sable, M.D., F.A.C.C.

## 2017-12-19 NOTE — Patient Instructions (Addendum)
Your physician wants you to follow-up in: 1 YEAR WITH DR KONESWARAN You will receive a reminder letter in the mail two months in advance. If you don't receive a letter, please call our office to schedule the follow-up appointment.  Your physician recommends that you continue on your current medications as directed. Please refer to the Current Medication list given to you today.  Thank you for choosing [facility]!!    

## 2018-02-14 ENCOUNTER — Other Ambulatory Visit: Payer: Self-pay | Admitting: Cardiovascular Disease

## 2018-04-15 ENCOUNTER — Other Ambulatory Visit: Payer: Self-pay | Admitting: Cardiovascular Disease

## 2018-07-03 LAB — T4, FREE: Free T4: 1.4 ng/dL (ref 0.8–1.8)

## 2018-07-03 LAB — TSH: TSH: 4.78 mIU/L — ABNORMAL HIGH (ref 0.40–4.50)

## 2018-07-08 ENCOUNTER — Other Ambulatory Visit: Payer: Self-pay | Admitting: "Endocrinology

## 2018-07-10 ENCOUNTER — Ambulatory Visit (INDEPENDENT_AMBULATORY_CARE_PROVIDER_SITE_OTHER): Payer: Commercial Managed Care - PPO | Admitting: "Endocrinology

## 2018-07-10 ENCOUNTER — Encounter: Payer: Self-pay | Admitting: "Endocrinology

## 2018-07-10 VITALS — BP 134/80 | HR 86 | Ht 65.0 in | Wt 173.0 lb

## 2018-07-10 DIAGNOSIS — E890 Postprocedural hypothyroidism: Secondary | ICD-10-CM

## 2018-07-10 MED ORDER — LEVOTHYROXINE SODIUM 125 MCG PO TABS: 125.0000 ug | ORAL_TABLET | Freq: Every day | ORAL | 3 refills | Status: DC

## 2018-07-10 NOTE — Progress Notes (Signed)
HPI  Bethany Blankenship is a 58 y.o.-year-old female, she is here to follow-up after treatment for Graves' disease with RAI on 11/17/2014. She is currently on levothyroxine 112 mcg p.o. every morning.  She does not have acute complaints today.  She has a steady weight, her previsit thyroid function tests are consistent with under replacement.    ROS: Constitutional: +  Steady weight. - fatigue, - hypothermia Eyes: no blurry vision, no xerophthalmia ENT: no sore throat, no nodules palpated in throat, no dysphagia/odynophagia, no hoarseness Cardiovascular: No chest pain, no shortness of breath, no palpitations.    Skin: no rashes Neurological: no tremors/numbness/tingling/dizziness Psychiatric: no depression/anxiety  PE: BP 134/80   Pulse 86   Ht 5\' 5"  (1.651 m)   Wt 173 lb (78.5 kg)   BMI 28.79 kg/m  Wt Readings from Last 3 Encounters:  07/10/18 173 lb (78.5 kg)  12/19/17 174 lb (78.9 kg)  07/05/17 173 lb (78.5 kg)   Constitutional: overweight, in NAD Eyes: PERRLA, EOMI, no exophthalmos ENT: moist mucous membranes, no thyromegaly, no cervical lymphadenopathy Musculoskeletal: no deformities, strength intact in all 4 Skin: moist, warm, no rashes Neurological: no tremor with outstretched hands, DTR  + normal on bilateral lower extremities.     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   -I discussed and increased her levothyroxine to 125 mcg p.o. every morning.     - We discussed about the correct intake of her thyroid hormone, on empty stomach at fasting, with water,  separated by at least 30 minutes from breakfast and other medications,  and separated by more than 4 hours from calcium, iron, multivitamins, acid reflux medications (PPIs). -Patient is made aware of the fact that thyroid hormone replacement is needed for life, dose to be adjusted by periodic monitoring of thyroid function tests.  -Patient may benefit from screening bone density during her annual physical at her PCP office.  Glade Lloyd, MD Phone: [phone]  Fax: [phone]  -  This note was partially dictated with voice recognition software. Similar sounding words can be transcribed inadequately or may not  be corrected upon review.  07/10/2018, 11:21 AM

## 2018-10-07 ENCOUNTER — Other Ambulatory Visit: Payer: Self-pay | Admitting: "Endocrinology

## 2018-11-04 ENCOUNTER — Other Ambulatory Visit: Payer: Self-pay | Admitting: Cardiovascular Disease

## 2018-11-08 ENCOUNTER — Ambulatory Visit: Payer: Self-pay | Admitting: "Endocrinology

## 2018-11-13 LAB — TSH: TSH: 2.47 mIU/L (ref 0.40–4.50)

## 2018-11-13 LAB — T4, FREE: FREE T4: 1.8 ng/dL (ref 0.8–1.8)

## 2018-12-04 ENCOUNTER — Ambulatory Visit (INDEPENDENT_AMBULATORY_CARE_PROVIDER_SITE_OTHER): Payer: Commercial Managed Care - PPO | Admitting: "Endocrinology

## 2018-12-04 ENCOUNTER — Encounter: Payer: Self-pay | Admitting: "Endocrinology

## 2018-12-04 ENCOUNTER — Other Ambulatory Visit: Payer: Self-pay

## 2018-12-04 DIAGNOSIS — E890 Postprocedural hypothyroidism: Secondary | ICD-10-CM | POA: Diagnosis not present

## 2018-12-04 MED ORDER — LEVOTHYROXINE SODIUM 125 MCG PO TABS: ORAL_TABLET | ORAL | 1 refills | Status: DC

## 2018-12-04 NOTE — Progress Notes (Signed)
    12/04/2018                                Endocrinology Telehealth Visit Follow up Note -During COVID -19 Pandemic  I connected with Bethany Blankenship on 12/04/2018   by telephone and verified that I am speaking with the correct person using two identifiers. Bethany Blankenship, Dec 23, 1960. she has verbally consented to this visit. All issues noted in this document were discussed and addressed. The format was not optimal for physical exam  HPI  Bethany Blankenship is a 58 y.o.-year-old female, she is being performed for follow-up of RAI induced hypothyroidism.  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 She is currently on levothyroxine 125 mcg p.o. every morning.  She does not have acute complaints today.  She has a steady weight, her previsit thyroid function tests are consistent with under replacement.    ROS: Limited as above. PE: There were no vitals taken for this visit. Wt Readings from Last 3 Encounters:  07/10/18 173 lb (78.5 kg)  12/19/17 174 lb (78.9 kg)  07/05/17 173 lb (78.5 kg)      Recent Results (from the past 2160 hour(s))  TSH     Status: None   Collection Time: 11/13/18 10:45 AM  Result Value Ref Range   TSH 2.47 0.40 - 4.50 mIU/L  T4, free     Status: None   Collection Time: 11/13/18 10:45 AM  Result Value Ref Range   Free T4 1.8 0.8 - 1.8 ng/dL    ASSESSMENT: 1. 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 -I discussed and increased her levothyroxine to 125 mcg p.o. every morning.     - We discussed about the correct intake of her thyroid hormone, on empty stomach at fasting, with water, separated by at least 30 minutes from breakfast and other medications,  and separated by more than 4 hours from calcium, iron, multivitamins, acid reflux medications (PPIs). -Patient is made aware of the fact that thyroid hormone replacement is needed for life, dose to be  adjusted by periodic monitoring of thyroid function tests.   -Patient may benefit from screening bone density during her annual physical at her PCP office.  Glade Lloyd, MD Phone: [phone]  Fax: [phone]  -  This note was partially dictated with voice recognition software. Similar sounding words can be transcribed inadequately or may not  be corrected upon review.  12/04/2018, 1:06 PM

## 2018-12-31 ENCOUNTER — Telehealth: Payer: Self-pay | Admitting: *Deleted

## 2018-12-31 NOTE — Telephone Encounter (Signed)
Patient verbally consented for telehealth visit with [facility] and understands that her insurance company will be billed for the encounter.   Aware to have vitals available

## 2019-01-03 ENCOUNTER — Telehealth (INDEPENDENT_AMBULATORY_CARE_PROVIDER_SITE_OTHER): Payer: Commercial Managed Care - PPO | Admitting: Cardiovascular Disease

## 2019-01-03 ENCOUNTER — Encounter: Payer: Self-pay | Admitting: Cardiovascular Disease

## 2019-01-03 VITALS — BP 118/81 | HR 75 | Ht 65.0 in | Wt 173.0 lb

## 2019-01-03 DIAGNOSIS — I480 Paroxysmal atrial fibrillation: Secondary | ICD-10-CM

## 2019-01-03 NOTE — Patient Instructions (Addendum)

## 2019-01-03 NOTE — Progress Notes (Signed)
Virtual Visit via Telephone Note   This visit type was conducted due to national recommendations for restrictions regarding the COVID-19 Pandemic (e.g. social distancing) in an effort to limit this patient's exposure and mitigate transmission in our community.  Due to her co-morbid illnesses, this patient is at least at moderate risk for complications without adequate follow up.  This format is felt to be most appropriate for this patient at this time.  The patient did not have access to video technology/had technical difficulties with video requiring transitioning to audio format only (telephone).  All issues noted in this document were discussed and addressed.  No physical exam could be performed with this format.  Please refer to the patient's chart for her  consent to telehealth for [facility].   Date:  01/03/2019   ID:  [name], DOB 11-Oct-1960, MRN [id number]  Patient Location: Home Provider Location: Office  PCP:  Manon Hilding, MD  Cardiologist:  Kate Sable, MD  Electrophysiologist:  None   Evaluation Performed:  Follow-Up Visit  Chief Complaint:  PAF  History of Present Illness:    Bethany Blankenship is a 58 y.o. female with paroxysmal atrial fibrillation.  The patient denies any symptoms of chest pain, palpitations, shortness of breath, lightheadedness, dizziness, leg swelling, orthopnea, PND, and syncope.  The patient does not have symptoms concerning for COVID-19 infection (fever, chills, cough, or new shortness of breath).   She and her husband stay busy taking care of their grandchildren 3 days/week.   Past Medical History:  Diagnosis Date  . 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 NO PAST SURGERIES       Current Meds  Medication Sig  . aspirin EC 81 MG tablet Take 81 mg by mouth daily.  . fluticasone (FLONASE) 50 MCG/ACT nasal spray Place into both nostrils as needed.    Marland Kitchen levothyroxine (SYNTHROID) 125 MCG tablet TAKE 1 TABLET BY MOUTH EVERY DAY BEFORE BREAKFAST  . metoprolol succinate (TOPROL-XL) 25 MG 24 hr tablet TAKE 1/2 TABLET BY MOUTH DAILY  . omeprazole (PRILOSEC) 40 MG capsule Take 40 mg by mouth daily.  . propafenone (RYTHMOL) 225 MG tablet TAKE ONE TABLET BY MOUTH EVERY 8 HOURS     Allergies:   Patient has no known allergies.   Social History   Tobacco Use  . Smoking status: Never Smoker  . Smokeless tobacco: Never Used  Substance Use Topics  . Alcohol use: No    Alcohol/week: 0.0 standard drinks  . Drug use: No     Family Hx: The patient's family history includes CAD in her father.  ROS:   Please see the history of present illness.     All other systems reviewed and are negative.   Prior CV studies:   The following studies were reviewed today:  NA  Labs/Other Tests and Data Reviewed:    EKG:  No ECG reviewed.  Recent Labs: 11/13/2018: TSH 2.47   Recent Lipid Panel No results found for: CHOL, TRIG, HDL, CHOLHDL, LDLCALC, LDLDIRECT  Wt Readings from Last 3 Encounters:  01/03/19 173 lb (78.5 kg)  07/10/18 173 lb (78.5 kg)  12/19/17 174 lb (78.9 kg)     Objective:    Vital Signs:  BP 118/81   Pulse 75   Ht 5\' 5"  (1.651 m)   Wt 173 lb (78.5 kg)   BMI 28.79 kg/m    VITAL SIGNS:  reviewed  ASSESSMENT & PLAN:    1.  Paroxysmal atrial fibrillation: Symptomatically stable on Toprol-XL 12.5 mg daily and Rythmol 225 mg every 8 hours.  No indication for anticoagulation as CHADSVASC score is 1.   COVID-19 Education: The signs and symptoms of COVID-19 were discussed with the patient and how to seek care for testing (follow up with PCP or arrange E-visit).  The importance of social distancing was discussed today.  Time:   Today, I have spent 5 minutes with the patient with telehealth technology discussing the above problems.     Medication Adjustments/Labs and Tests Ordered: Current medicines are reviewed at length  with the patient today.  Concerns regarding medicines are outlined above.   Tests Ordered: No orders of the defined types were placed in this encounter.   Medication Changes: No orders of the defined types were placed in this encounter.   Follow Up:  1 yr in office  Signed, Kate Sable, MD  01/03/2019 11:21 AM    [place]

## 2019-04-17 ENCOUNTER — Other Ambulatory Visit: Payer: Self-pay | Admitting: Cardiovascular Disease

## 2019-06-05 LAB — TSH: TSH: 5.27 mIU/L — ABNORMAL HIGH (ref 0.40–4.50)

## 2019-06-05 LAB — T4, FREE: Free T4: 1.4 ng/dL (ref 0.8–1.8)

## 2019-06-09 ENCOUNTER — Ambulatory Visit: Payer: Commercial Managed Care - PPO | Admitting: "Endocrinology

## 2019-06-11 ENCOUNTER — Ambulatory Visit (INDEPENDENT_AMBULATORY_CARE_PROVIDER_SITE_OTHER): Payer: Commercial Managed Care - PPO | Admitting: "Endocrinology

## 2019-06-11 ENCOUNTER — Encounter: Payer: Self-pay | Admitting: "Endocrinology

## 2019-06-11 DIAGNOSIS — E890 Postprocedural hypothyroidism: Secondary | ICD-10-CM

## 2019-06-11 MED ORDER — LEVOTHYROXINE SODIUM 137 MCG PO TABS: ORAL_TABLET | ORAL | 3 refills | Status: DC

## 2019-06-11 NOTE — Progress Notes (Signed)
    06/11/2019                                Endocrinology Telehealth Visit Follow up Note -During COVID -19 Pandemic  I connected with [name] on 06/11/2019   by telephone and verified that I am speaking with the correct person using two identifiers. [name], 1961-05-11. she has verbally consented to this visit. All issues noted in this document were discussed and addressed. The format was not optimal for physical exam  HPI  TOBITHA OVERTON is a 59 y.o.-year-old female, she is being engaged in telehealth via telephone in follow-up of RAI induced hypothyroidism.  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 She is currently on levothyroxine 125 mcg p.o. every morning.  She does not have acute complaints today.  She has a steady weight, her previsit thyroid function tests are consistent with under replacement.    ROS: Limited as above. PE: There were no vitals taken for this visit. Wt Readings from Last 3 Encounters:  01/03/19 173 lb (78.5 kg)  07/10/18 173 lb (78.5 kg)  12/19/17 174 lb (78.9 kg)      Recent Results (from the past 2160 hour(s))  TSH     Status: Abnormal   Collection Time: 06/04/19  9:38 AM  Result Value Ref Range   TSH 5.27 (H) 0.40 - 4.50 mIU/L  T4, free     Status: None   Collection Time: 06/04/19  9:38 AM  Result Value Ref Range   Free T4 1.4 0.8 - 1.8 ng/dL    ASSESSMENT: 1. 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  I discussed and increased her levothyroxine to 137 mcg p.o. every morning.     - We discussed about the correct intake of her thyroid hormone, on empty stomach at fasting, with water, separated by at least 30 minutes from breakfast and other medications,  and separated by more than 4 hours from calcium, iron, multivitamins, acid reflux medications (PPIs). -Patient is made aware of the fact that thyroid hormone replacement is  needed for life, dose to be adjusted by periodic monitoring of thyroid function tests.  -Patient may benefit from screening bone density during her annual physical at her PCP office.  Glade Lloyd, MD Phone: [phone]  Fax: [phone]  -  This note was partially dictated with voice recognition software. Similar sounding words can be transcribed inadequately or may not  be corrected upon review.  06/11/2019, 11:20 AM

## 2019-08-10 ENCOUNTER — Other Ambulatory Visit: Payer: Self-pay | Admitting: Cardiovascular Disease

## 2019-09-10 ENCOUNTER — Other Ambulatory Visit: Payer: Self-pay | Admitting: "Endocrinology

## 2019-10-01 LAB — T4, FREE: Free T4: 1.4 ng/dL (ref 0.8–1.8)

## 2019-10-01 LAB — TSH: TSH: 3.91 mIU/L (ref 0.40–4.50)

## 2019-10-09 ENCOUNTER — Ambulatory Visit: Payer: Commercial Managed Care - PPO | Admitting: "Endocrinology

## 2019-10-22 ENCOUNTER — Other Ambulatory Visit: Payer: Self-pay

## 2019-10-22 ENCOUNTER — Encounter: Payer: Self-pay | Admitting: "Endocrinology

## 2019-10-22 ENCOUNTER — Ambulatory Visit (INDEPENDENT_AMBULATORY_CARE_PROVIDER_SITE_OTHER): Payer: Commercial Managed Care - PPO | Admitting: "Endocrinology

## 2019-10-22 VITALS — BP 131/87 | HR 89 | Ht 65.0 in | Wt 170.6 lb

## 2019-10-22 DIAGNOSIS — E890 Postprocedural hypothyroidism: Secondary | ICD-10-CM | POA: Diagnosis not present

## 2019-10-22 MED ORDER — LEVOTHYROXINE SODIUM 137 MCG PO TABS: ORAL_TABLET | ORAL | 3 refills | Status: DC

## 2019-10-22 NOTE — Progress Notes (Signed)
    10/22/2019            Endocrinology follow-up note   HPI  Bethany Blankenship is a 59 y.o.-year-old female, she is being seen in follow-up of RAI induced hypothyroidism.  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 She is currently on levothyroxine 137 mcg p.o. every morning.  She reports compliance with medication.  She has no new complaints today.  She has a steady weight, her previsit thyroid function tests are consistent with appropriate replacement.     ROS: Limited as above. PE: BP 131/87   Pulse 89   Ht 5\' 5"  (1.651 m)   Wt 170 lb 9.6 oz (77.4 kg)   BMI 28.39 kg/m  Wt Readings from Last 3 Encounters:  10/22/19 170 lb 9.6 oz (77.4 kg)  01/03/19 173 lb (78.5 kg)  07/10/18 173 lb (78.5 kg)    Physical Exam- Limited  Constitutional:  Body mass index is 28.39 kg/m. , not in acute distress, normal state of mind Eyes:  EOMI, no exophthalmos Neck: Supple Thyroid: No gross goiter Respiratory: Adequate breathing efforts Musculoskeletal: no gross deformities, strength intact in all four extremities, no gross restriction of joint movements Skin:  no rashes, no hyperemia Neurological: no tremor with outstretched hands,     Recent Results (from the past 2160 hour(s))  TSH     Status: None   Collection Time: 10/01/19  9:39 AM  Result Value Ref Range   TSH 3.91 0.40 - 4.50 mIU/L  T4, free     Status: None   Collection Time: 10/01/19  9:39 AM  Result Value Ref Range   Free T4 1.4 0.8 - 1.8 ng/dL    ASSESSMENT: 1. 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  She is advised to continue levothyroxine 137 mcg daily before breakfast.     - We discussed about the correct intake of her thyroid hormone, on empty stomach at fasting, with water, separated by at least 30 minutes from breakfast and other medications,  and separated by more than 4 hours from calcium, iron,  multivitamins, acid reflux medications (PPIs). -Patient is made aware of the fact that thyroid hormone replacement is needed for life, dose to be adjusted by periodic monitoring of thyroid function tests.   -Patient may benefit from screening bone density during her annual physical at her PCP office.      - Time spent on this patient care encounter:  20 minutes of which 50% was spent in  counseling and the rest reviewing  her current and  previous labs / studies and medications  doses and developing a plan for long term care. [name]  participated in the discussions, expressed understanding, and voiced agreement with the above plans.  All questions were answered to her satisfaction. she is encouraged to contact clinic should she have any questions or concerns prior to her return visit.  Glade Lloyd, MD Phone: [phone]  Fax: [phone]  -  This note was partially dictated with voice recognition software. Similar sounding words can be transcribed inadequately or may not  be corrected upon review.  10/22/2019, 11:25 AM

## 2020-01-06 ENCOUNTER — Encounter: Payer: Self-pay | Admitting: Cardiology

## 2020-01-06 NOTE — Progress Notes (Signed)
Cardiology Office Note  Date: 01/07/2020   ID: Bethany Blankenship, DOB 06/22/1960, MRN [id number]  PCP:  Manon Hilding, MD  Cardiologist:  Rozann Lesches, MD Electrophysiologist:  None   Chief Complaint  Patient presents with  . History of atrial fibrillation    History of Present Illness: Bethany Blankenship is a 59 y.o. female former patient of Dr. Bronson Ing now presenting to establish follow-up with me.  I reviewed her records and updated the chart.  She was last assessed via telehealth encounter in August 2020.  She presents today reporting no significant sense of palpitations or increasing breathlessness with typical activities.  Her follow-up ECG today which I personally reviewed shows atrial fibrillation/flutter with controlled ventricular response, IVCD and nonspecific ST segment changes.  Her last tracing was in 2019 at which point she was in sinus bradycardia.  CHA2DS2-VASc score is 1, she has not been anticoagulated so far.  Recent TSH was normal.  Past Medical History:  Diagnosis Date  . 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 History of atrial fibrillation   . Hypothyroidism     Past Surgical History:  Procedure Laterality Date  . NO PAST SURGERIES      Current Outpatient Medications  Medication Sig Dispense Refill  . aspirin EC 81 MG tablet Take 81 mg by mouth daily.    . fluticasone (FLONASE) 50 MCG/ACT nasal spray Place into both nostrils as needed.     Marland Kitchen levothyroxine (SYNTHROID) 137 MCG tablet As directed. 90 tablet 3  . metoprolol succinate (TOPROL-XL) 25 MG 24 hr tablet TAKE 1/2 TABLET BY MOUTH DAILY 45 tablet 3  . omeprazole (PRILOSEC) 40 MG capsule Take 40 mg by mouth daily.     No current facility-administered medications for this visit.   Allergies:  Patient has no known allergies.   Social History: The patient  reports that she has never smoked. She has never used smokeless tobacco. She reports that she does not drink alcohol  and does not use drugs.   Family History: The patient's family history includes CAD in her father.   ROS:   No dizziness or syncope.  Physical Exam: VS:  BP 118/78   Pulse 76   Ht 5\' 5"  (1.651 m)   Wt 171 lb 3.2 oz (77.7 kg)   SpO2 94%   BMI 28.49 kg/m , BMI Body mass index is 28.49 kg/m.  Wt Readings from Last 3 Encounters:  01/07/20 171 lb 3.2 oz (77.7 kg)  10/22/19 170 lb 9.6 oz (77.4 kg)  01/03/19 173 lb (78.5 kg)    General: Patient appears comfortable at rest. HEENT: Conjunctiva and lids normal, wearing a mask. Neck: Supple, no elevated JVP or carotid bruits, no thyromegaly. Lungs: Clear to auscultation, nonlabored breathing at rest. Cardiac: Irregularly irregular, no S3 or significant systolic murmur, no pericardial rub. Extremities: No pitting edema, distal pulses 2+.  ECG:  An ECG dated 12/19/2017 was personally reviewed today and demonstrated:  Sinus bradycardia.  Recent Labwork: 10/01/2019: TSH 3.91   Other Studies Reviewed Today:  No interval cardiac testing for review.  Assessment and Plan:  1.  Persistent atrial fibrillation/flutter, duration uncertain.  CHA2DS2-VASc score is 1.  Rhythm is documented by ECG today, her last tracing was in 2019 at which point she was in sinus bradycardia.  She does not describe any obvious progressive sense of palpitations or shortness of breath.  Heart rate control is reasonable today.  We have  discussed various treatment strategies ranging from initiation of anticoagulation with cardioversion attempts and continuation of antiarrhythmic therapy, versus managing with strategy of heart rate control since she is not significantly symptomatic.  For now our plan will be to discontinue Rythmol, continue Toprol-XL realizing that this could be further uptitrated if additional heart rate control is necessary.  Continue aspirin.  We will obtain a follow-up echocardiogram to reassess cardiac structure and function.  2.  History of Graves'  disease with thyroid storm status post RAI and now hypothyroidism on Synthroid replacement.  She continues to follow with endocrinology.  Medication Adjustments/Labs and Tests Ordered: Current medicines are reviewed at length with the patient today.  Concerns regarding medicines are outlined above.   Tests Ordered: Orders Placed This Encounter  Procedures  . EKG 12-Lead  . ECHOCARDIOGRAM COMPLETE    Medication Changes: No orders of the defined types were placed in this encounter.   Disposition:  Follow up 3 months in the [place] office.  Signed, Satira Sark, MD, [facility] 01/07/2020 10:09 AM    [facility] [place], [place], [place] [address] Phone: [phone]; Fax: [phone]

## 2020-01-07 ENCOUNTER — Other Ambulatory Visit: Payer: Self-pay | Admitting: Cardiology

## 2020-01-07 ENCOUNTER — Encounter: Payer: Self-pay | Admitting: Cardiology

## 2020-01-07 ENCOUNTER — Ambulatory Visit (INDEPENDENT_AMBULATORY_CARE_PROVIDER_SITE_OTHER): Payer: Commercial Managed Care - PPO | Admitting: Cardiology

## 2020-01-07 VITALS — BP 118/78 | HR 76 | Ht 65.0 in | Wt 171.2 lb

## 2020-01-07 DIAGNOSIS — Z8679 Personal history of other diseases of the circulatory system: Secondary | ICD-10-CM

## 2020-01-07 DIAGNOSIS — I4891 Unspecified atrial fibrillation: Secondary | ICD-10-CM

## 2020-01-07 DIAGNOSIS — E890 Postprocedural hypothyroidism: Secondary | ICD-10-CM | POA: Diagnosis not present

## 2020-01-07 DIAGNOSIS — I4819 Other persistent atrial fibrillation: Secondary | ICD-10-CM | POA: Diagnosis not present

## 2020-01-07 NOTE — Patient Instructions (Addendum)
Medication Instructions:   Your physician has recommended you make the following change in your medication:   Stop Rhythmol (propafenone)  Continue other medications the same  Labwork:  NONE  Testing/Procedures: Your physician has requested that you have an echocardiogram. Echocardiography is a painless test that uses sound waves to create images of your heart. It provides your doctor with information about the size and shape of your heart and how well your heart's chambers and valves are working. This procedure takes approximately one hour. There are no restrictions for this procedure.  Follow-Up:  Your physician recommends that you schedule a follow-up appointment in: 3 months.  Any Other Special Instructions Will Be Listed Below (If Applicable).  If you need a refill on your cardiac medications before your next appointment, please call your pharmacy.

## 2020-01-14 ENCOUNTER — Other Ambulatory Visit: Payer: Commercial Managed Care - PPO

## 2020-01-21 ENCOUNTER — Telehealth: Payer: Self-pay | Admitting: Cardiology

## 2020-01-21 ENCOUNTER — Ambulatory Visit (INDEPENDENT_AMBULATORY_CARE_PROVIDER_SITE_OTHER): Payer: Commercial Managed Care - PPO

## 2020-01-21 ENCOUNTER — Other Ambulatory Visit: Payer: Self-pay

## 2020-01-21 DIAGNOSIS — I4891 Unspecified atrial fibrillation: Secondary | ICD-10-CM | POA: Diagnosis not present

## 2020-01-21 LAB — ECHOCARDIOGRAM COMPLETE
MV M vel: 4.89 m/s
MV Peak grad: 95.6 mmHg
P 1/2 time: 579 msec
S' Lateral: 4.14 cm

## 2020-01-21 NOTE — Telephone Encounter (Signed)
2nd attempt - no answer

## 2020-01-21 NOTE — Telephone Encounter (Signed)
Patient had her echo study today. She is requesting to have a nurse call her in regards to her medications.

## 2020-01-22 ENCOUNTER — Telehealth: Payer: Self-pay | Admitting: *Deleted

## 2020-01-22 DIAGNOSIS — I480 Paroxysmal atrial fibrillation: Secondary | ICD-10-CM

## 2020-01-22 DIAGNOSIS — I4819 Other persistent atrial fibrillation: Secondary | ICD-10-CM

## 2020-01-22 DIAGNOSIS — Z79899 Other long term (current) drug therapy: Secondary | ICD-10-CM

## 2020-01-22 DIAGNOSIS — Z8679 Personal history of other diseases of the circulatory system: Secondary | ICD-10-CM

## 2020-01-22 NOTE — Telephone Encounter (Signed)
Patient informed and verbalized understanding of plan. Copy sent to PCP Lab orders faxed to [facility] 48 hour monitor enrolled with [employer]

## 2020-01-22 NOTE — Telephone Encounter (Signed)
-----  Message from Satira Sark, MD sent at 01/22/2020  8:16 AM EDT ----- Results reviewed. Former patient of Dr. Bronson Ing that I recently met in the office. Echocardiogram shows reduced LVEF at 30 to 35%, new in comparison to prior evaluation. This does increase her relative stroke risk with atrial fibrillation, CHA2DS2-VASc score is 2. We would generally consider anticoagulation instead of aspirin in this case. Also concerned that perhaps heart rate control has not been optimal in atrial fibrillation/flutter, which could be a cause of reduced ejection fraction. Please obtain a 24 to 48-hour cardiac monitor to evaluate heart rate variability, schedule follow-up in the office over the next few weeks with CBC and BMET. Might need further up titration of Toprol-XL and we can have discussion regarding possible switch from aspirin to perhaps Eliquis.

## 2020-01-26 NOTE — Telephone Encounter (Signed)
3rd attempt no answer.

## 2020-02-09 ENCOUNTER — Ambulatory Visit (INDEPENDENT_AMBULATORY_CARE_PROVIDER_SITE_OTHER): Payer: Commercial Managed Care - PPO

## 2020-02-09 DIAGNOSIS — I480 Paroxysmal atrial fibrillation: Secondary | ICD-10-CM | POA: Diagnosis not present

## 2020-02-18 ENCOUNTER — Telehealth: Payer: Self-pay | Admitting: *Deleted

## 2020-02-18 MED ORDER — METOPROLOL SUCCINATE ER 25 MG PO TB24
25.0000 mg | ORAL_TABLET | Freq: Every day | ORAL | 1 refills | Status: DC
Start: 2020-02-18 — End: 2020-08-03

## 2020-02-18 NOTE — Telephone Encounter (Signed)
-----   Message from Satira Sark, MD sent at 02/17/2020  9:25 AM EDT ----- Results reviewed.  Noted to be in atrial flutter throughout, average heart rate in the 90s with some episodes into the 130s.  Heart rates over 100 occurred approximately 21% of the time.  Would suggest that we increase her Toprol-XL to 25 mg daily, keep office follow-up for further discussion.

## 2020-02-18 NOTE — Telephone Encounter (Signed)
Patient informed and verbalized understanding of plan. Copy sent to PCP 

## 2020-03-05 LAB — BASIC METABOLIC PANEL
BUN/Creatinine Ratio: 12 (calc) (ref 6–22)
BUN: 14 mg/dL (ref 7–25)
CO2: 26 mmol/L (ref 20–32)
Calcium: 9.3 mg/dL (ref 8.6–10.4)
Chloride: 107 mmol/L (ref 98–110)
Creat: 1.15 mg/dL — ABNORMAL HIGH (ref 0.50–0.99)
Glucose, Bld: 103 mg/dL — ABNORMAL HIGH (ref 65–99)
Potassium: 4.6 mmol/L (ref 3.5–5.3)
Sodium: 141 mmol/L (ref 135–146)

## 2020-03-05 LAB — CBC
HCT: 38.8 % (ref 35.0–45.0)
Hemoglobin: 11.6 g/dL — ABNORMAL LOW (ref 11.7–15.5)
MCH: 21 pg — ABNORMAL LOW (ref 27.0–33.0)
MCHC: 29.9 g/dL — ABNORMAL LOW (ref 32.0–36.0)
MCV: 70.3 fL — ABNORMAL LOW (ref 80.0–100.0)
MPV: 9.3 fL (ref 7.5–12.5)
Platelets: 319 10*3/uL (ref 140–400)
RBC: 5.52 10*6/uL — ABNORMAL HIGH (ref 3.80–5.10)
RDW: 17.2 % — ABNORMAL HIGH (ref 11.0–15.0)
WBC: 6.2 10*3/uL (ref 3.8–10.8)

## 2020-03-08 ENCOUNTER — Telehealth: Payer: Self-pay | Admitting: *Deleted

## 2020-03-08 ENCOUNTER — Ambulatory Visit: Payer: Commercial Managed Care - PPO | Admitting: Cardiology

## 2020-03-08 NOTE — Telephone Encounter (Signed)
Results reviewed. Creatinine mildly increased at 1.15. CBC already reviewed. Keep scheduled follow-up to discuss further medication options.

## 2020-03-08 NOTE — Telephone Encounter (Signed)
-----   Message from Satira Sark, MD sent at 03/04/2020  4:19 PM EDT ----- Results reviewed.  Hemoglobin mildly reduced at 11.6 (11.7 normal range).  Await results of BMET.

## 2020-03-09 NOTE — Progress Notes (Signed)
Cardiology Office Note  Date: 03/10/2020   ID: [name], DOB [birth year], MRN [id number]  PCP:  Bethany Blankenship  Cardiologist:  Rozann Lesches, Blankenship Electrophysiologist:  None   Chief Complaint  Patient presents with  . Cardiac follow-up    History of Present Illness: Bethany Blankenship is a 59 y.o. female that I met in August, a former patient of Dr. Bronson Blankenship.  She was found to be in persistent atrial fibrillation/flutter of uncertain duration at the last office encounter.  She was referred for a follow-up echocardiogram which showed new reduction in LVEF at 30 to 35%, normal RV contraction, mildly dilated left atrium, mild to moderate mitral regurgitation.  Anterior/anteroseptal walls described as hypokinetic.  Cardiac monitor was then obtained to better assess heart rate variability, average heart rate in the 90s with episodes into the 130s and tachycardia occurring 21% of the time.  Toprol-XL was further advanced.  She presents today for follow-up, we went over the results of her recent testing.  She still does not feel any sense of palpitations.  Interestingly, when we stopped Rythmol at the last encounter, she ultimately started to feel worse, I suspect due to increase in heart rate. She ultimately went back on Rythmol 225 mg every 8 hours along with her Toprol-XL 25 mg daily.  She is feeling better, still out of rhythm.  She had follow-up lab work which is noted below.  CHA2DS2-VASc score is at least 2 at this time.  We discussed switch from aspirin to Eliquis.  Past Medical History:  Diagnosis Date  . 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 History of atrial fibrillation   . Hypothyroidism     Past Surgical History:  Procedure Laterality Date  . NO PAST SURGERIES      Current Outpatient Medications  Medication Sig Dispense Refill  . fluticasone (FLONASE) 50 MCG/ACT nasal spray Place into both nostrils as needed.     Marland Blankenship levothyroxine (SYNTHROID)  137 MCG tablet As directed. 90 tablet 3  . metoprolol succinate (TOPROL-XL) 25 MG 24 hr tablet Take 1 tablet (25 mg total) by mouth daily. 90 tablet 1  . omeprazole (PRILOSEC) 40 MG capsule Take 40 mg by mouth daily.    . propafenone (RYTHMOL) 225 MG tablet Take 225 mg by mouth every 8 (eight) hours.    Marland Blankenship apixaban (ELIQUIS) 5 MG TABS tablet Take 1 tablet (5 mg total) by mouth 2 (two) times daily. 60 tablet 6  . sacubitril-valsartan (ENTRESTO) 24-26 MG Take 1 tablet by mouth 2 (two) times daily. 60 tablet 6   No current facility-administered medications for this visit.   Allergies:  Patient has no known allergies.   Social History: The patient  reports that she has never smoked. She has never used smokeless tobacco. She reports that she does not drink alcohol and does not use drugs.   Family History: The patient's family history includes CAD in her father.   ROS: No orthopnea or PND, no syncope.  Physical Exam: VS:  BP (!) 122/92   Pulse 82   Ht _0  (1.651 m)   Wt 170 lb (77.1 kg)   SpO2 92%   BMI 28.29 kg/m , BMI Body mass index is 28.29 kg/m.  Wt Readings from Last 3 Encounters:  03/10/20 170 lb (77.1 kg)  01/07/20 171 lb 3.2 oz (77.7 kg)  10/22/19 170 lb 9.6 oz (77.4 kg)    General: Patient appears  comfortable at rest. HEENT: Conjunctiva and lids normal, wearing a mask. Neck: Supple, no elevated JVP or carotid bruits, no thyromegaly. Lungs: Clear to auscultation, nonlabored breathing at rest. Cardiac: Irregularly irregular, no S3 or significant systolic murmur, no pericardial rub. Abdomen: Soft, nontender, bowel sounds present. Extremities: No pitting edema, distal pulses 2+.  ECG:  An ECG dated 01/07/2020 was personally reviewed today and demonstrated:  Atrial flutter with variable conduction, IVCD.  Recent Labwork: 10/01/2019: TSH 3.91 03/04/2020: BUN 14; Creat 1.15; Hemoglobin 11.6; Platelets 319; Potassium 4.6; Sodium 141   Other Studies Reviewed  Today:  Echocardiogram 01/21/2020: 1. The anterior and anteroseptal walls are hypokinetic. Marland Blankenship Left  ventricular ejection fraction, by estimation, is 30 to 35%. The left  ventricle has moderately decreased function. The left ventricle  demonstrates regional wall motion abnormalities (see  scoring diagram/findings for description). Left ventricular diastolic  parameters are indeterminate.  2. Right ventricular systolic function is normal. The right ventricular  size is normal.  3. Left atrial size was mildly dilated.  4. The mitral valve is normal in structure. Mild to moderate mitral valve  regurgitation. No evidence of mitral stenosis.  5. The aortic valve is tricuspid. Aortic valve regurgitation is mild. No  aortic stenosis is present.   Cardiac monitor 02/17/2020: [employer] monitor reviewed.  2 days analyzed.  Typical atrial flutter is the predominant rhythm.  Heart rate ranged from 63 bpm up to 132 bpm and average heart rate 91 bpm.  Tachycardic episodes represented approximately 21% of rhythm recording.  Patient triggered events did not correlate with a specific heart rate response in atrial flutter ranging from the 70s to the 120s. There were no pauses.  Assessment and Plan:  1.  Persistent atrial fibrillation/flutter with CHA2DS2-VASc score of 2.  We discussed various treatment approaches today.  She has failed cardioversion in the past and it does not appear that Rythmol was effective long-term.  Plan at this point is to stop aspirin and initiate Eliquis 5 mg twice daily for stroke prophylaxis, I reviewed her recent lab work.  Continue Toprol-XL 25 mg daily and for now Rythmol 225 mg every 8 hours.  We did discuss potential for a cardioversion attempt but might adopt a strategy of heart rate control and anticoagulation if her LVEF improves.  2.  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  Continue Toprol-XL, start Entresto 24/26 mg twice daily with follow-up BMET in 7 to 10 days.  We will see her back for clinical reevaluation and ultimately repeat an echocardiogram over the next 2 months.  3.  Hypothyroidism on Synthroid with prior history of Graves' disease status post RAI.  TSH normal in May.   Medication Adjustments/Labs and Tests Ordered: Current medicines are reviewed at length with the patient today.  Concerns regarding medicines are outlined above.   Tests Ordered: Orders Placed This Encounter  Procedures  . Basic metabolic panel    Medication Changes: Meds ordered this encounter  Medications  . apixaban (ELIQUIS) 5 MG TABS tablet    Sig: Take 1 tablet (5 mg total) by mouth 2 (two) times daily.    Dispense:  60 tablet    Refill:  6    03/10/2020 NEW-has 30 day voucher  . sacubitril-valsartan (ENTRESTO) 24-26 MG    Sig: Take 1 tablet by mouth 2 (two) times daily.    Dispense:  60  tablet    Refill:  6    03/10/2020 NEW-has voucher    Disposition:  Follow up December as scheduled.  Signed, Satira Sark, Blankenship, [facility] 03/10/2020 11:38 AM    [facility] [place] [facility] [place], [place], [place] [address] Phone: [phone]; Fax: [phone]

## 2020-03-10 ENCOUNTER — Encounter: Payer: Self-pay | Admitting: Cardiology

## 2020-03-10 ENCOUNTER — Ambulatory Visit (INDEPENDENT_AMBULATORY_CARE_PROVIDER_SITE_OTHER): Payer: Commercial Managed Care - PPO | Admitting: Cardiology

## 2020-03-10 VITALS — BP 122/92 | HR 82 | Ht 65.0 in | Wt 170.0 lb

## 2020-03-10 DIAGNOSIS — I4892 Unspecified atrial flutter: Secondary | ICD-10-CM | POA: Diagnosis not present

## 2020-03-10 DIAGNOSIS — I4891 Unspecified atrial fibrillation: Secondary | ICD-10-CM

## 2020-03-10 DIAGNOSIS — I429 Cardiomyopathy, unspecified: Secondary | ICD-10-CM | POA: Diagnosis not present

## 2020-03-10 MED ORDER — ENTRESTO 24-26 MG PO TABS: 1.0000 | ORAL_TABLET | Freq: Two times a day (BID) | ORAL | 6 refills | Status: DC

## 2020-03-10 MED ORDER — APIXABAN 5 MG PO TABS: 5.0000 mg | ORAL_TABLET | Freq: Two times a day (BID) | ORAL | 6 refills | Status: DC

## 2020-03-10 NOTE — Telephone Encounter (Signed)
Discussed during office visit today Copy sent to PCP  

## 2020-03-10 NOTE — Patient Instructions (Addendum)
Medication Instructions:   Your physician has recommended you make the following change in your medication:   Stop aspirin  Start eliquis 5 mg by mouth twice daily-use voucher  Start entresto 24/26 mg by mouth twice daily-use voucher  Continue other medications the same  Labwork:  Your physician recommends that you return for non-fasting lab work in: 7-10 days to check your BMET. This may be done at [facility] or [facility] ([place] Monday-Friday from 8:00 am - 4:00 pm. No appointment is needed.  Testing/Procedures:  None  Follow-Up:  Your physician recommends that you schedule a follow-up appointment in: as planned on 04/21/2020  Any Other Special Instructions Will Be Listed Below (If Applicable).  If you need a refill on your cardiac medications before your next appointment, please call your pharmacy.

## 2020-03-25 ENCOUNTER — Telehealth: Payer: Self-pay | Admitting: *Deleted

## 2020-03-25 LAB — BASIC METABOLIC PANEL
BUN/Creatinine Ratio: 9 (calc) (ref 6–22)
BUN: 12 mg/dL (ref 7–25)
CO2: 28 mmol/L (ref 20–32)
Calcium: 9.2 mg/dL (ref 8.6–10.4)
Chloride: 108 mmol/L (ref 98–110)
Creat: 1.32 mg/dL — ABNORMAL HIGH (ref 0.50–0.99)
Glucose, Bld: 88 mg/dL (ref 65–139)
Potassium: 4.8 mmol/L (ref 3.5–5.3)
Sodium: 141 mmol/L (ref 135–146)

## 2020-03-25 NOTE — Telephone Encounter (Signed)
-----   Message from Satira Sark, MD sent at 03/25/2020  8:22 AM EST ----- Results reviewed.  Creatinine has bumped up to 1.32 from 1.15, potassium normal.  This is after starting Entresto.  Continue with current regimen.

## 2020-03-30 ENCOUNTER — Encounter: Payer: Self-pay | Admitting: *Deleted

## 2020-03-30 NOTE — Telephone Encounter (Signed)
Letter mailed with results. 

## 2020-04-21 ENCOUNTER — Ambulatory Visit (INDEPENDENT_AMBULATORY_CARE_PROVIDER_SITE_OTHER): Payer: Commercial Managed Care - PPO | Admitting: Cardiology

## 2020-04-21 ENCOUNTER — Encounter: Payer: Self-pay | Admitting: Cardiology

## 2020-04-21 VITALS — BP 124/78 | HR 98 | Ht 65.0 in | Wt 170.8 lb

## 2020-04-21 DIAGNOSIS — I4892 Unspecified atrial flutter: Secondary | ICD-10-CM

## 2020-04-21 DIAGNOSIS — I4891 Unspecified atrial fibrillation: Secondary | ICD-10-CM | POA: Diagnosis not present

## 2020-04-21 DIAGNOSIS — I429 Cardiomyopathy, unspecified: Secondary | ICD-10-CM | POA: Diagnosis not present

## 2020-04-21 NOTE — Progress Notes (Signed)
Cardiology Office Note  Date: 04/21/2020   ID: Bethany Blankenship, DOB 09/15/1960, MRN [id number]  PCP:  Manon Hilding, MD  Cardiologist:  Rozann Lesches, MD Electrophysiologist:  None   Chief Complaint  Patient presents with  . Cardiac follow-up    History of Present Illness: Bethany Blankenship is a 59 y.o. female last seen in October. She presents for a follow-up visit, states that she feels reasonably well, no sense of palpitations or progressive shortness of breath.  Delene Loll was initiated at the last visit, follow-up lab work noted below. She has tolerated her current medications. No bleeding problems on Eliquis.  Past Medical History:  Diagnosis Date  . 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 History of atrial fibrillation   . Hypothyroidism     Past Surgical History:  Procedure Laterality Date  . NO PAST SURGERIES      Current Outpatient Medications  Medication Sig Dispense Refill  . apixaban (ELIQUIS) 5 MG TABS tablet Take 1 tablet (5 mg total) by mouth 2 (two) times daily. 60 tablet 6  . fluticasone (FLONASE) 50 MCG/ACT nasal spray Place into both nostrils as needed.     Marland Kitchen levothyroxine (SYNTHROID) 137 MCG tablet As directed. 90 tablet 3  . metoprolol succinate (TOPROL-XL) 25 MG 24 hr tablet Take 1 tablet (25 mg total) by mouth daily. 90 tablet 1  . omeprazole (PRILOSEC) 40 MG capsule Take 40 mg by mouth daily.    . propafenone (RYTHMOL) 225 MG tablet Take 225 mg by mouth every 8 (eight) hours.    . sacubitril-valsartan (ENTRESTO) 24-26 MG Take 1 tablet by mouth 2 (two) times daily. 60 tablet 6   No current facility-administered medications for this visit.   Allergies:  Patient has no known allergies.   ROS: No dizziness or syncope.  Physical Exam: VS:  BP 124/78   Pulse 98   Ht 5\' 5"  (1.651 m)   Wt 170 lb 12.8 oz (77.5 kg)   SpO2 100%   BMI 28.42 kg/m , BMI Body mass index is 28.42 kg/m.  Wt Readings from Last 3 Encounters:   04/21/20 170 lb 12.8 oz (77.5 kg)  03/10/20 170 lb (77.1 kg)  01/07/20 171 lb 3.2 oz (77.7 kg)    General: Patient appears comfortable at rest. HEENT: Conjunctiva and lids normal, wearing a mask. Neck: Supple, no elevated JVP or carotid bruits, no thyromegaly. Lungs: Clear to auscultation, nonlabored breathing at rest. Cardiac: Irregularly irregular, no S3 or significant systolic murmur, no pericardial rub. Extremities: No pitting edema.  ECG:  An ECG dated 01/07/2020 was personally reviewed today and demonstrated:  Atrial flutter with variable conduction, IVCD.  Recent Labwork: 10/01/2019: TSH 3.91 03/04/2020: Hemoglobin 11.6; Platelets 319 03/24/2020: BUN 12; Creat 1.32; Potassium 4.8; Sodium 141   Other Studies Reviewed Today:  Echocardiogram 01/21/2020: 1. The anterior and anteroseptal walls are hypokinetic. Marland Kitchen Left  ventricular ejection fraction, by estimation, is 30 to 35%. The left  ventricle has moderately decreased function. The left ventricle  demonstrates regional wall motion abnormalities (see  scoring diagram/findings for description). Left ventricular diastolic  parameters are indeterminate.  2. Right ventricular systolic function is normal. The right ventricular  size is normal.  3. Left atrial size was mildly dilated.  4. The mitral valve is normal in structure. Mild to moderate mitral valve  regurgitation. No evidence of mitral stenosis.  5. The aortic valve is tricuspid. Aortic valve regurgitation is mild. No  aortic stenosis is present.   Cardiac monitor 02/17/2020: [employer] monitor reviewed. 2 days analyzed. Typical atrial flutter is the predominant rhythm. Heart rate ranged from 63 bpm up to 132 bpm and average heart rate 91 bpm. Tachycardic episodes represented approximately 21% of rhythm recording. Patient triggered events did not correlate with a specific heart rate response in atrial flutter ranging from the 70s to the 120s. There were no  pauses.  Assessment and Plan:  1. Persistent atrial fibrillation/flutter. CHA2DS2-VASc score is 2. She continues on Eliquis for stroke prophylaxis. She has failed prior cardioversion in the past, at this point remains on Toprol-XL and Rythmol, heart rate is controlled but she remains out of rhythm.  2. 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 Continue Toprol-XL and Entresto, no evidence of fluid overload and currently not on standing diuretic. Will reevaluate LVEF in the next 8 weeks. If this improves suggesting tachycardia-mediated cardiomyopathy, we will likely adopt a strategy of heart rate control of atrial fibrillation and modify her therapy with discontinuation of Rythmol. If she has persistent cardiomyopathy, I will refer her to the atrial fibrillation clinic to discuss change in antiarrhythmic therapy and another cardioversion.  Medication Adjustments/Labs and Tests Ordered: Current medicines are reviewed at length with the patient today.  Concerns regarding medicines are outlined above.   Tests Ordered: Orders Placed This Encounter  Procedures  . ECHOCARDIOGRAM COMPLETE    Medication Changes: No orders of the defined types were placed in this encounter.   Disposition:  Follow up after echocardiogram.  Signed, Satira Sark, MD, [facility] 04/21/2020 11:27 AM    [facility] [place], [place], [place] [address] Phone: [phone]; Fax: [phone]

## 2020-04-21 NOTE — Patient Instructions (Signed)
Your physician recommends that you schedule a follow-up appointment in: 2 [place]  Your physician recommends that you continue on your current medications as directed. Please refer to the Current Medication list given to you today.  Your physician has requested that you have an echocardiogram [place]. Echocardiography is a painless test that uses sound waves to create images of your heart. It provides your doctor with information about the size and shape of your heart and how well your heart's chambers and valves are working. This procedure takes approximately one hour. There are no restrictions for this procedure.  Thank you for choosing [facility]!!

## 2020-05-06 ENCOUNTER — Other Ambulatory Visit: Payer: Self-pay | Admitting: Cardiology

## 2020-06-16 ENCOUNTER — Other Ambulatory Visit: Payer: Commercial Managed Care - PPO

## 2020-06-23 ENCOUNTER — Ambulatory Visit: Payer: Commercial Managed Care - PPO | Admitting: Cardiology

## 2020-08-03 ENCOUNTER — Other Ambulatory Visit: Payer: Self-pay | Admitting: Cardiology

## 2020-08-11 ENCOUNTER — Ambulatory Visit (INDEPENDENT_AMBULATORY_CARE_PROVIDER_SITE_OTHER): Payer: Commercial Managed Care - PPO

## 2020-08-11 ENCOUNTER — Other Ambulatory Visit: Payer: Commercial Managed Care - PPO

## 2020-08-11 DIAGNOSIS — I429 Cardiomyopathy, unspecified: Secondary | ICD-10-CM | POA: Diagnosis not present

## 2020-08-11 LAB — ECHOCARDIOGRAM COMPLETE
AR max vel: 1.57 cm2
AV Area VTI: 1.72 cm2
AV Area mean vel: 1.58 cm2
AV Mean grad: 3.4 mmHg
AV Peak grad: 6.6 mmHg
AV Vena cont: 0.25 cm
Ao pk vel: 1.28 m/s
Area-P 1/2: 5.42 cm2
Calc EF: 40.4 %
MV M vel: 2.14 m/s
MV Peak grad: 18.4 mmHg
P 1/2 time: 1218 msec
S' Lateral: 4.36 cm
Single Plane A2C EF: 39.6 %
Single Plane A4C EF: 40.2 %

## 2020-08-12 ENCOUNTER — Telehealth: Payer: Self-pay | Admitting: *Deleted

## 2020-08-12 DIAGNOSIS — I4891 Unspecified atrial fibrillation: Secondary | ICD-10-CM

## 2020-08-12 NOTE — Telephone Encounter (Signed)
-----   Message from Satira Sark, MD sent at 08/11/2020 12:52 PM EDT ----- Results reviewed.  LVEF has improved to the range of 40 to 45% since last assessment, but still not normalized.  Since she remains in atrial fibrillation and failed previous cardioversion on Rythmol, let's refer her to the atrial fibrillation clinic for discussion of a switch in antiarrhythmic therapy and further attempt at restoring sinus rhythm if possible.

## 2020-08-17 NOTE — Telephone Encounter (Signed)
Patient informed and verbalized understanding of plan. Appointment with Domenic Polite tomorrow isn't needed per [facility] could probably go ahead with the atrial fibrillation clinic visit and put ours off until after that assessment. Patient aware.

## 2020-08-18 ENCOUNTER — Ambulatory Visit: Payer: Commercial Managed Care - PPO | Admitting: Cardiology

## 2020-08-25 ENCOUNTER — Ambulatory Visit (HOSPITAL_COMMUNITY): Payer: Commercial Managed Care - PPO | Admitting: Physician Assistant

## 2020-09-01 ENCOUNTER — Ambulatory Visit (HOSPITAL_COMMUNITY): Payer: Commercial Managed Care - PPO | Admitting: Physician Assistant

## 2020-09-07 NOTE — Progress Notes (Signed)
Primary Care Physician: Manon Hilding, MD Primary Cardiologist: Dr Domenic Polite Primary Electrophysiologist: none Referring Physician: Dr Trevor Iha is a 60 y.o. 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  The patient was initially diagnosed with atrial fibrillation remotely and had been maintained on propafenone. Patient is on Eliquis for a CHADS2VASC score of 2. On Echo 01/2020 her EF was decreased at 30-35%. Her rate control was increased and she was started on Entresto. Echo 08/11/20 showed her EF had improved 40-45%. She is fairly asymptomatic with her afib. She denies significant snoring or alcohol use.   Today, she denies symptoms of palpitations, chest pain, shortness of breath, orthopnea, PND, lower extremity edema, dizziness, presyncope, syncope, snoring, daytime somnolence, bleeding, or neurologic sequela. The patient is tolerating medications without difficulties and is otherwise without complaint today.    Atrial Fibrillation Risk Factors:  she does not have symptoms or diagnosis of sleep apnea.. she does not have a history of rheumatic fever. she does not have a history of alcohol use. The patient does not have a history of early familial atrial fibrillation or other arrhythmias.  she has a BMI of Body mass index is 28.22 kg/m.Marland Kitchen Filed Weights   09/08/20 0940  Weight: 76.9 kg    Family History  Problem Relation Age of Onset  . CAD Father      Atrial Fibrillation Management history:  Previous antiarrhythmic drugs: propafenone  Previous cardioversions: 2017 x 2 Previous ablations: none CHADS2VASC score: 2 Anticoagulation history: Eliquis   Past Medical History:  Diagnosis Date  . 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 History of atrial fibrillation   . Hypothyroidism    Past  Surgical History:  Procedure Laterality Date  . NO PAST SURGERIES      Current Outpatient Medications  Medication Sig Dispense Refill  . apixaban (ELIQUIS) 5 MG TABS tablet Take 1 tablet (5 mg total) by mouth 2 (two) times daily. 60 tablet 6  . fluticasone (FLONASE) 50 MCG/ACT nasal spray Place into both nostrils as needed.     Marland Kitchen levothyroxine (SYNTHROID) 137 MCG tablet As directed. 90 tablet 3  . metoprolol succinate (TOPROL-XL) 25 MG 24 hr tablet TAKE 1 TABLET BY MOUTH DAILY 90 tablet 1  . omeprazole (PRILOSEC) 40 MG capsule Take 40 mg by mouth daily.    . propafenone (RYTHMOL) 225 MG tablet TAKE ONE TABLET BY MOUTH EVERY 8 HOURS 270 tablet 2  . sacubitril-valsartan (ENTRESTO) 24-26 MG Take 1 tablet by mouth 2 (two) times daily. 60 tablet 6   No current facility-administered medications for this encounter.    No Known Allergies  Social History   Socioeconomic History  . Marital status: Married    Spouse name: Not on file  . Number of children: Not on file  . Years of education: Not on file  . Highest education level: Not on file  Occupational History  . Not on file  Tobacco Use  . Smoking status: Never Smoker  . Smokeless tobacco: Never Used  Vaping Use  . Vaping Use: Never used  Substance and Sexual Activity  . Alcohol use: No    Alcohol/week: 0.0 standard drinks  . Drug use: No  . Sexual activity: Not on file  Other Topics Concern  . Not on file  Social History Narrative  .  Not on file   Social Determinants of Health   Financial Resource Strain: Not on file  Food Insecurity: Not on file  Transportation Needs: Not on file  Physical Activity: Not on file  Stress: Not on file  Social Connections: Not on file  Intimate Partner Violence: Not on file     ROS- All systems are reviewed and negative except as per the HPI above.  Physical Exam: Vitals:   09/08/20 0940  BP: 132/88  Pulse: 77  Weight: 76.9 kg  Height: 5\' 5"  (1.651 m)    GEN- The patient is  a well appearing female, alert and oriented x 3 today.   Head- normocephalic, atraumatic Eyes-  Sclera clear, conjunctiva pink Ears- hearing intact Oropharynx- clear Neck- supple  Lungs- Clear to ausculation bilaterally, normal work of breathing Heart- irregular rate and rhythm, no murmurs, rubs or gallops  GI- soft, NT, ND, + BS Extremities- no clubbing, cyanosis, or edema MS- no significant deformity or atrophy Skin- no rash or lesion Psych- euthymic mood, full affect Neuro- strength and sensation are intact  Wt Readings from Last 3 Encounters:  09/08/20 76.9 kg  04/21/20 77.5 kg  03/10/20 77.1 kg    EKG today demonstrates  Afib Vent. rate 77 BPM PR interval * ms QRS duration 106 ms QT/QTcB 376/425 ms  Echo 08/11/20 demonstrated  1. Left ventricular ejection fraction, by estimation, is 40 to 45%. The  left ventricle has normal function. The left ventricle demonstrates global  hypokinesis. Left ventricular diastolic parameters are indeterminate.  2. Right ventricular systolic function is low normal. The right  ventricular size is mildly enlarged.  3. Left atrial size was moderately dilated.  4. The mitral valve is normal in structure. Mild mitral valve  regurgitation. No evidence of mitral stenosis.  5. The aortic valve is tricuspid. Aortic valve regurgitation is trivial.  No aortic stenosis is present.  6. The inferior vena cava is normal in size with greater than 50%  respiratory variability, suggesting right atrial pressure of 3 mmHg.   Comparison(s): Echocardiogram done 01/21/20 showed an Ef of 30-35%.   Epic records are reviewed at length today  CHA2DS2-VASc Score = 2  The patient's score is based upon: CHF History: Yes HTN History: No Diabetes History: No Stroke History: No Vascular Disease History: No Age Score: 0 Gender Score: 1      ASSESSMENT AND PLAN: 1. Persistent Atrial Fibrillation/atrial flutter The patient's CHA2DS2-VASc score is 2,  indicating a 2.2% annual risk of stroke.   We discussed therapeutic options today including alternate AAD and ablation. With her reduced EF, will stop propafenone. Can titrate BB as needed for rate control. Patient agreeable to ablation evaluation. With her systolic dysfunction, she may be a good candidate. If not, she is agreeable to dofetilide admission. Information given today. QT in SR 438 ms. Check mag. Continue Eliquis 5 mg BID Continue Toprol 25 mg daily  2. Chronic systolic CHF No signs or symptoms of fluid overload. Possibly related to #1.   Follow up with EP to discuss ablation vs dofetilide.    [facility] [address] [place], [place] [address] [phone] 09/08/2020 10:07 AM

## 2020-09-08 ENCOUNTER — Ambulatory Visit (HOSPITAL_COMMUNITY)
Admission: RE | Admit: 2020-09-08 | Discharge: 2020-09-08 | Disposition: A | Discharge status: Home / Self Care | Payer: Commercial Managed Care - PPO | Source: Ambulatory Visit | Attending: Physician Assistant | Admitting: Physician Assistant

## 2020-09-08 ENCOUNTER — Other Ambulatory Visit: Payer: Self-pay

## 2020-09-08 ENCOUNTER — Encounter (HOSPITAL_COMMUNITY): Payer: Self-pay | Admitting: Physician Assistant

## 2020-09-08 VITALS — BP 132/88 | HR 77 | Ht 65.0 in | Wt 169.6 lb

## 2020-09-08 DIAGNOSIS — I4892 Unspecified atrial flutter: Secondary | ICD-10-CM | POA: Insufficient documentation

## 2020-09-08 DIAGNOSIS — I4819 Other persistent atrial fibrillation: Secondary | ICD-10-CM | POA: Diagnosis not present

## 2020-09-08 DIAGNOSIS — Z7901 Long term (current) use of anticoagulants: Secondary | ICD-10-CM | POA: Insufficient documentation

## 2020-09-08 DIAGNOSIS — I4891 Unspecified atrial fibrillation: Secondary | ICD-10-CM | POA: Diagnosis present

## 2020-09-08 DIAGNOSIS — Z8249 Family history of ischemic heart disease and other diseases of the circulatory system: Secondary | ICD-10-CM | POA: Diagnosis not present

## 2020-09-08 DIAGNOSIS — Z79899 Other long term (current) drug therapy: Secondary | ICD-10-CM | POA: Insufficient documentation

## 2020-09-08 DIAGNOSIS — I5022 Chronic systolic (congestive) heart failure: Secondary | ICD-10-CM | POA: Insufficient documentation

## 2020-09-08 DIAGNOSIS — I447 Left bundle-branch block, unspecified: Secondary | ICD-10-CM | POA: Insufficient documentation

## 2020-09-08 LAB — MAGNESIUM: Magnesium: 2.4 mg/dL (ref 1.7–2.4)

## 2020-09-08 NOTE — Patient Instructions (Signed)
Stop propafenone

## 2020-09-20 ENCOUNTER — Other Ambulatory Visit: Payer: Self-pay | Admitting: Cardiology

## 2020-09-22 ENCOUNTER — Other Ambulatory Visit: Payer: Commercial Managed Care - PPO

## 2020-10-13 ENCOUNTER — Telehealth: Payer: Self-pay

## 2020-10-13 DIAGNOSIS — E890 Postprocedural hypothyroidism: Secondary | ICD-10-CM

## 2020-10-13 NOTE — Telephone Encounter (Signed)
Done

## 2020-10-13 NOTE — Telephone Encounter (Signed)
Can you update pt's lab orders for [facility]?

## 2020-10-20 ENCOUNTER — Encounter: Payer: Self-pay | Admitting: Internal Medicine

## 2020-10-20 ENCOUNTER — Other Ambulatory Visit: Payer: Self-pay

## 2020-10-20 ENCOUNTER — Ambulatory Visit (INDEPENDENT_AMBULATORY_CARE_PROVIDER_SITE_OTHER): Payer: Commercial Managed Care - PPO | Admitting: Internal Medicine

## 2020-10-20 ENCOUNTER — Encounter: Payer: Self-pay | Admitting: *Deleted

## 2020-10-20 VITALS — BP 110/78 | HR 128 | Ht 65.0 in | Wt 165.0 lb

## 2020-10-20 DIAGNOSIS — I4819 Other persistent atrial fibrillation: Secondary | ICD-10-CM

## 2020-10-20 DIAGNOSIS — D6869 Other thrombophilia: Secondary | ICD-10-CM | POA: Diagnosis not present

## 2020-10-20 DIAGNOSIS — I4891 Unspecified atrial fibrillation: Secondary | ICD-10-CM

## 2020-10-20 DIAGNOSIS — I5022 Chronic systolic (congestive) heart failure: Secondary | ICD-10-CM | POA: Diagnosis not present

## 2020-10-20 MED ORDER — METOPROLOL SUCCINATE ER 50 MG PO TB24: 50.0000 mg | ORAL_TABLET | Freq: Every day | ORAL | 3 refills | Status: DC

## 2020-10-20 NOTE — Patient Instructions (Addendum)
Medication Instructions:  Increase Metoprolol Succinate to 50 mg daily  Your physician recommends that you continue on your current medications as directed. Please refer to the Current Medication list given to you today.  Labwork: None ordered.  Testing/Procedures: Your physician has requested that you have cardiac CT. Cardiac computed tomography (CT) is a painless test that uses an x-ray machine to take clear, detailed pictures of your heart. For further information please visit [employer]. Please follow instruction sheet as given.  Your physician has recommended that you have an ablation. Catheter ablation is a medical procedure used to treat some cardiac arrhythmias (irregular heartbeats). During catheter ablation, a long, thin, flexible tube is put into a blood vessel in your groin (upper thigh), or neck. This tube is called an ablation catheter. It is then guided to your heart through the blood vessel. Radio frequency waves destroy small areas of heart tissue where abnormal heartbeats may cause an arrhythmia to start. Please see the instruction sheet given to you today.  Any Other Special Instructions Will Be Listed Below (If Applicable).  If you need a refill on your cardiac medications before your next appointment, please call your pharmacy.    Cardiac Ablation Cardiac ablation is a procedure to destroy (ablate) some heart tissue that is sending bad signals. These bad signals cause problems in heart rhythm. The heart has many areas that make these signals. If there are problems in these areas, they can make the heart beat in a way that is not normal. Destroying some tissues can help make the heart rhythm normal. Tell your doctor about:  Any allergies you have.  All medicines you are taking. These include vitamins, herbs, eye drops, creams, and over-the-counter medicines.  Any problems you or family members have had with medicines that make you fall asleep  (anesthetics).  Any blood disorders you have.  Any surgeries you have had.  Any medical conditions you have, such as kidney failure.  Whether you are pregnant or may be pregnant. What are the risks? This is a safe procedure. But problems may occur, including:  Infection.  Bruising and bleeding.  Bleeding into the chest.  Stroke or blood clots.  Damage to nearby areas of your body.  Allergies to medicines or dyes.  The need for a pacemaker if the normal system is damaged.  Failure of the procedure to treat the problem. What happens before the procedure? Medicines Ask your doctor about:  Changing or stopping your normal medicines. This is important.  Taking aspirin and ibuprofen. Do not take these medicines unless your doctor tells you to take them.  Taking other medicines, vitamins, herbs, and supplements. General instructions  Follow instructions from your doctor about what you cannot eat or drink.  Plan to have someone take you home from the hospital or clinic.  If you will be going home right after the procedure, plan to have someone with you for 24 hours.  Ask your doctor what steps will be taken to prevent infection. What happens during the procedure?  An IV tube will be put into one of your veins.  You will be given a medicine to help you relax.  The skin on your neck or groin will be numbed.  A cut (incision) will be made in your neck or groin. A needle will be put through your cut and into a large vein.  A tube (catheter) will be put into the needle. The tube will be moved to your heart.  Dye may be  put through the tube. This helps your doctor see your heart.  Small devices (electrodes) on the tube will send out signals.  A type of energy will be used to destroy some heart tissue.  The tube will be taken out.  Pressure will be held on your cut. This helps stop bleeding.  A bandage will be put over your cut. The exact procedure may vary among  doctors and hospitals.   What happens after the procedure?  You will be watched until you leave the hospital or clinic. This includes checking your heart rate, breathing rate, oxygen, and blood pressure.  Your cut will be watched for bleeding. You will need to lie still for a few hours.  Do not drive for 24 hours or as long as your doctor tells you. Summary  Cardiac ablation is a procedure to destroy some heart tissue. This is done to treat heart rhythm problems.  Tell your doctor about any medical conditions you may have. Tell him or her about all medicines you are taking to treat them.  This is a safe procedure. But problems may occur. These include infection, bruising, bleeding, and damage to nearby areas of your body.  Follow what your doctor tells you about food and drink. You may also be told to change or stop some of your medicines.  After the procedure, do not drive for 24 hours or as long as your doctor tells you. This information is not intended to replace advice given to you by your health care provider. Make sure you discuss any questions you have with your health care provider. Document Revised: 04/03/2019 Document Reviewed: 04/03/2019 Elsevier Patient Education  2021 [employer].

## 2020-10-20 NOTE — Progress Notes (Signed)
Electrophysiology Office Note   Date:  10/20/2020   ID:  Bethany Blankenship, DOB 07-Feb-1961, MRN [id number]  PCP:  Manon Hilding, MD  Cardiologist:  Dr Domenic Polite Primary Electrophysiologist: Thompson Grayer, MD    CC: afib   History of Present Illness: Bethany Blankenship is a 60 y.o. female who presents today for electrophysiology evaluation.   She is referred by Adline Peals and Dr Domenic Polite for EP consultation regarding afib.   The patient has had afib for about 10 years.  This began in the setting of thyroid storm requiriing RAI previously.  Her EF is 40%  She has failed medical therapy with propafenone.   + fatigue and decreased exercise tolerance with afib. Today, she denies symptoms of palpitations, chest pain, shortness of breath, orthopnea, PND, lower extremity edema, claudication, dizziness, presyncope, syncope, bleeding, or neurologic sequela. The patient is tolerating medications without difficulties and is otherwise without complaint today.    Past Medical History:  Diagnosis Date  . Chronic systolic dysfunction of left ventricle   . GERD (gastroesophageal reflux disease)   . 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 Hypothyroidism   . Persistent atrial fibrillation [facility])    Past Surgical History:  Procedure Laterality Date  . NO PAST SURGERIES       Current Outpatient Medications  Medication Sig Dispense Refill  . ELIQUIS 5 MG TABS tablet Take 1 tablet by mouth twice daily 300 tablet 0  . ENTRESTO 24-26 MG Take 1 tablet by mouth twice daily 180 tablet 2  . fluticasone (FLONASE) 50 MCG/ACT nasal spray Place into both nostrils as needed.     Marland Kitchen levothyroxine (SYNTHROID) 137 MCG tablet As directed. 90 tablet 3  . metoprolol succinate (TOPROL-XL) 25 MG 24 hr tablet TAKE 1 TABLET BY MOUTH DAILY 90 tablet 1  . omeprazole (PRILOSEC) 40 MG capsule Take 40 mg by mouth daily.    . Cephalexin 500 MG tablet Take 500 mg by mouth 3 (three) times daily.    . predniSONE  (DELTASONE) 20 MG tablet Take by mouth. Use as directed    . triamcinolone cream (KENALOG) 0.1 % Use as directed     No current facility-administered medications for this visit.    Allergies:   Patient has no known allergies.   Social History:  The patient  reports that she has never smoked. She has never used smokeless tobacco. She reports that she does not drink alcohol and does not use drugs.   Family History:  The patient's family history includes CAD in her father.    ROS:  Please see the history of present illness.   All other systems are personally reviewed and negative.    PHYSICAL EXAM: VS:  BP 110/78   Pulse (!) 128   Ht 5\' 5"  (1.651 m)   Wt 165 lb (74.8 kg)   SpO2 99%   BMI 27.46 kg/m  , BMI Body mass index is 27.46 kg/m. GEN: Well nourished, well developed, in no acute distress HEENT: normal Neck: no JVD, carotid bruits, or masses Cardiac: iRRR; no murmurs, rubs, or gallops,no edema  Respiratory:  clear to auscultation bilaterally, normal work of breathing GI: soft, nontender, nondistended, + BS MS: no deformity or atrophy Skin: warm and dry  Neuro:  Strength and sensation are intact Psych: euthymic mood, full affect  EKG:  EKG is ordered today. The ekg ordered today is personally reviewed and shows afib, V rates 128 bpm  Recent Labs: 03/04/2020: Hemoglobin 11.6; Platelets 319 03/24/2020: BUN 12; Creat 1.32; Potassium 4.8; Sodium 141 09/08/2020: Magnesium 2.4  personally reviewed   Lipid Panel  No results found for: CHOL, TRIG, HDL, CHOLHDL, VLDL, LDLCALC, LDLDIRECT personally reviewed   Wt Readings from Last 3 Encounters:  10/20/20 165 lb (74.8 kg)  09/08/20 169 lb 9.6 oz (76.9 kg)  04/21/20 170 lb 12.8 oz (77.5 kg)      Other studies personally reviewed: Additional studies/ records that were reviewed today include: AF clinic notes, prior echo  Review of the above records today demonstrates: as above  Echo 08/11/20- EF 40-45% Moderate LA  enlargement   ASSESSMENT AND PLAN:  1.  Persistent afib The patient has symptomatic, recurrent atrial fibrillation. she has failed medical therapy with propafenone. Chads2vasc score is at least 2.  she is anticoagulated with eliquis . Therapeutic strategies for afib including medicine (tikosyn, amiodarone) and ablation were discussed in detail with the patient today. Risk, benefits, and alternatives to EP study and radiofrequency ablation for afib were also discussed in detail today. These risks include but are not limited to stroke, bleeding, vascular damage, tamponade, perforation, damage to the esophagus, lungs, and other structures, pulmonary vein stenosis, worsening renal function, and death. The patient understands these risk and wishes to proceed.  We will therefore proceed with catheter ablation at the next available time.  Carto, ICE, anesthesia are requested for the procedure.  Will also obtain cardiac CT prior to the procedure to exclude LAA thrombus and further evaluate atrial anatomy.  2. Nonischemic CM Likely tachycardia mediated Hopefully will improve post ablation Increase toprol to 50mg  daily today  Risks, benefits and potential toxicities for medications prescribed and/or refilled reviewed with patient today.     Army Fossa, MD  10/20/2020 10:51 AM     [facility] [address] [place] [address] [phone] (office) [phone] (fax)

## 2020-10-22 ENCOUNTER — Ambulatory Visit: Payer: Commercial Managed Care - PPO | Admitting: "Endocrinology

## 2020-10-28 LAB — TSH: TSH: 0.017 u[IU]/mL — ABNORMAL LOW (ref 0.450–4.500)

## 2020-10-28 LAB — T4, FREE: Free T4: 2.42 ng/dL — ABNORMAL HIGH (ref 0.82–1.77)

## 2020-11-03 ENCOUNTER — Other Ambulatory Visit: Payer: Self-pay | Admitting: "Endocrinology

## 2020-11-03 ENCOUNTER — Other Ambulatory Visit: Payer: Self-pay

## 2020-11-03 ENCOUNTER — Ambulatory Visit (INDEPENDENT_AMBULATORY_CARE_PROVIDER_SITE_OTHER): Payer: Commercial Managed Care - PPO | Admitting: "Endocrinology

## 2020-11-03 ENCOUNTER — Encounter: Payer: Self-pay | Admitting: "Endocrinology

## 2020-11-03 VITALS — BP 82/60 | HR 72 | Ht 65.0 in | Wt 164.0 lb

## 2020-11-03 DIAGNOSIS — E890 Postprocedural hypothyroidism: Secondary | ICD-10-CM

## 2020-11-03 MED ORDER — LEVOTHYROXINE SODIUM 112 MCG PO TABS: ORAL_TABLET | ORAL | 1 refills | Status: DC

## 2020-11-03 NOTE — Progress Notes (Signed)
11/03/2020            Endocrinology follow-up note   HPI  Bethany Blankenship is a 60 y.o.-year-old female, she is being seen in follow-up of RAI induced hypothyroidism.  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 She is currently on levothyroxine 137 mcg p.o. every morning.  She presents with some weight loss, and her previsit thyroid function tests are consistent with over replacement.  She did have palpitations which made it necessary for her PMD to increase her beta-blocker to 50 mg of metoprolol daily.  She still remains on the same dose of levothyroxine.      ROS: Limited as above. PE: BP (!) 82/60   Pulse 72   Ht 5\' 5"  (1.651 m)   Wt 164 lb (74.4 kg)   BMI 27.29 kg/m  Wt Readings from Last 3 Encounters:  11/03/20 164 lb (74.4 kg)  10/20/20 165 lb (74.8 kg)  09/08/20 169 lb 9.6 oz (76.9 kg)    Physical Exam- Limited  Constitutional:  Body mass index is 27.29 kg/m. , not in acute distress, normal state of mind     Recent Results (from the past 2160 hour(s))  ECHOCARDIOGRAM COMPLETE     Status: None   Collection Time: 08/11/20 10:39 AM  Result Value Ref Range   S' Lateral 4.36 cm   Area-P 1/2 5.42 cm2   Single Plane A2C EF 39.6 %   Single Plane A4C EF 40.2 %   Calc EF 40.4 %   MV M vel 2.14 m/s   MV Peak grad 18.4 mmHg   P 1/2 time 1218 msec   AV Vena cont 0.25 cm   AV Area VTI 1.72 cm2   AV Area mean vel 1.58 cm2   AR max vel 1.57 cm2   Ao pk vel 1.28 m/s   AV Peak grad 6.6 mmHg   AV Mean grad 3.4 mmHg  Magnesium     Status: None   Collection Time: 09/08/20 10:30 AM  Result Value Ref Range   Magnesium 2.4 1.7 - 2.4 mg/dL    Comment: Performed at [facility], [address] [place], [place] [address]  T4, Free     Status: Abnormal   Collection Time: 10/27/20 10:04 AM  Result Value Ref Range   Free T4 2.42 (H) 0.82 - 1.77 ng/dL  TSH     Status: Abnormal   Collection Time: 10/27/20 10:04 AM   Result Value Ref Range   TSH 0.017 (L) 0.450 - 4.500 uIU/mL    ASSESSMENT: 1. 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  Hypotension PLAN:   -Her previsit thyroid consistent with over replacement.  I discussed and lowered her levothyroxine to 112 mcg p.o. daily before breakfast.     - We discussed about the correct intake of her thyroid hormone, on empty stomach at fasting, with water, separated by at least 30 minutes from breakfast and other medications,  and separated by more than 4 hours from calcium, iron, multivitamins, acid reflux medications (PPIs). -Patient is made aware of the fact that thyroid hormone replacement is needed for life, dose to be adjusted by periodic monitoring of thyroid function tests.  The over replacement with thyroid hormone could potentially explain her palpitation which triggered increase in her beta-blocker which in turn caused hypotension.  I have pressure to lower beta-blocker, however she wishes to discuss it with her cardiologist. Her blood pressure today was 82/60, pulse  rate 72.  She was not symptomatic. She is advised to maintain close follow-up with her PMD and cardiology.  -Patient may benefit from screening bone density during her annual physical at her PCP office.    I spent 25 minutes in the care of the patient today including review of labs from Thyroid Function, CMP, and other relevant labs ; imaging/biopsy records (current and previous including abstractions from other facilities); face-to-face time discussing  her lab results and symptoms, medications doses, her options of short and long term treatment based on the latest standards of care / guidelines;   and documenting the encounter.  [name]  participated in the discussions, expressed understanding, and voiced agreement with the above plans.  All questions were answered to her satisfaction. she is encouraged to contact clinic should she have any questions or concerns prior to her return  visit.   Glade Lloyd, MD Phone: [phone]  Fax: [phone]  -  This note was partially dictated with voice recognition software. Similar sounding words can be transcribed inadequately or may not  be corrected upon review.  11/03/2020, 12:36 PM

## 2020-11-10 ENCOUNTER — Telehealth: Payer: Self-pay | Admitting: Internal Medicine

## 2020-11-10 NOTE — Telephone Encounter (Signed)
New message:     Patient is having surgery soon and some information to let the doctor to know.

## 2020-11-10 NOTE — Telephone Encounter (Signed)
Phone just ringing, no answer, did not go to voicemail.

## 2020-11-17 ENCOUNTER — Other Ambulatory Visit: Payer: Commercial Managed Care - PPO

## 2020-11-17 ENCOUNTER — Other Ambulatory Visit: Payer: Self-pay

## 2020-11-17 DIAGNOSIS — I5022 Chronic systolic (congestive) heart failure: Secondary | ICD-10-CM

## 2020-11-17 DIAGNOSIS — D6869 Other thrombophilia: Secondary | ICD-10-CM

## 2020-11-17 DIAGNOSIS — I4819 Other persistent atrial fibrillation: Secondary | ICD-10-CM

## 2020-11-17 LAB — CBC WITH DIFFERENTIAL/PLATELET
Basophils Absolute: 0 10*3/uL (ref 0.0–0.2)
Basos: 0 %
EOS (ABSOLUTE): 0 10*3/uL (ref 0.0–0.4)
Eos: 0 %
Hematocrit: 38 % (ref 34.0–46.6)
Hemoglobin: 11.8 g/dL (ref 11.1–15.9)
Immature Grans (Abs): 0 10*3/uL (ref 0.0–0.1)
Immature Granulocytes: 1 %
Lymphocytes Absolute: 1.6 10*3/uL (ref 0.7–3.1)
Lymphs: 27 %
MCH: 22.4 pg — ABNORMAL LOW (ref 26.6–33.0)
MCHC: 31.1 g/dL — ABNORMAL LOW (ref 31.5–35.7)
MCV: 72 fL — ABNORMAL LOW (ref 79–97)
Monocytes Absolute: 0.3 10*3/uL (ref 0.1–0.9)
Monocytes: 6 %
Neutrophils Absolute: 3.9 10*3/uL (ref 1.4–7.0)
Neutrophils: 66 %
Platelets: 387 10*3/uL (ref 150–450)
RBC: 5.27 x10E6/uL (ref 3.77–5.28)
RDW: 16.7 % — ABNORMAL HIGH (ref 11.7–15.4)
WBC: 5.8 10*3/uL (ref 3.4–10.8)

## 2020-11-17 LAB — BASIC METABOLIC PANEL
BUN/Creatinine Ratio: 15 (ref 12–28)
BUN: 14 mg/dL (ref 8–27)
CO2: 20 mmol/L (ref 20–29)
Calcium: 9.4 mg/dL (ref 8.7–10.3)
Chloride: 105 mmol/L (ref 96–106)
Creatinine, Ser: 0.96 mg/dL (ref 0.57–1.00)
Glucose: 122 mg/dL — ABNORMAL HIGH (ref 65–99)
Potassium: 5.2 mmol/L (ref 3.5–5.2)
Sodium: 138 mmol/L (ref 134–144)
eGFR: 67 mL/min/{1.73_m2} (ref >59)

## 2020-11-24 NOTE — Telephone Encounter (Signed)
The patient wanted to let us know that her thyroid doctor felt like her thyroid medication was to high of a dose and this was causing her heart rate to run higher. The medication was reduced and her heart rate medication was increased. She just wanted to let us know before her surgery. Doses of medication are correct in epic.

## 2020-11-29 ENCOUNTER — Telehealth (HOSPITAL_COMMUNITY): Payer: Self-pay | Admitting: *Deleted

## 2020-11-29 NOTE — Telephone Encounter (Signed)
Reaching out to patient to offer assistance regarding upcoming cardiac imaging study; pt verbalizes understanding of appt date/time, parking situation and where to check in, pre-test NPO status  and verified current allergies; name and call back number provided for further questions should they arise ? ?Aldrin Engelhard RN Navigator Cardiac Imaging ?[facility] ?[phone] office ?[phone] cell ? ?

## 2020-12-01 ENCOUNTER — Encounter (HOSPITAL_COMMUNITY): Payer: Self-pay

## 2020-12-01 ENCOUNTER — Ambulatory Visit (HOSPITAL_COMMUNITY)
Admission: RE | Admit: 2020-12-01 | Discharge: 2020-12-01 | Disposition: A | Discharge status: Home / Self Care | Payer: Commercial Managed Care - PPO | Source: Ambulatory Visit | Attending: Internal Medicine | Admitting: Internal Medicine

## 2020-12-01 ENCOUNTER — Telehealth: Payer: Self-pay | Admitting: Internal Medicine

## 2020-12-01 ENCOUNTER — Other Ambulatory Visit: Payer: Self-pay

## 2020-12-01 ENCOUNTER — Other Ambulatory Visit (HOSPITAL_COMMUNITY): Payer: Self-pay | Admitting: *Deleted

## 2020-12-01 ENCOUNTER — Telehealth (HOSPITAL_COMMUNITY): Payer: Self-pay | Admitting: *Deleted

## 2020-12-01 DIAGNOSIS — I4819 Other persistent atrial fibrillation: Secondary | ICD-10-CM

## 2020-12-01 MED ORDER — METOPROLOL TARTRATE 100 MG PO TABS: ORAL_TABLET | ORAL | 0 refills | Status: DC

## 2020-12-01 NOTE — Telephone Encounter (Signed)
New Message:     Patient said she was supposed to have had a CT this morning at the hospital. She said they did not do. It. Patient heart rate was too high. She said the hospital was supposed to call and see what medicine Dr Rayann Heman wants her to be on.She said she have not heard anything since this morning.

## 2020-12-01 NOTE — Telephone Encounter (Signed)
Follow Up:   Patient said if you have to call her medicine in, please call to [facility] in [place], She have changed pharmacy.

## 2020-12-01 NOTE — Telephone Encounter (Signed)
Called pt to follow up on CT.  She reports that someone from [facility] called and gave her further instructions.  She is to take metoprolol tartrate 100 mg PO 2 hours prior to CT.  She is rescheduled to Friday 12/03/20 per pt. No further assistance needed at this time.

## 2020-12-01 NOTE — Telephone Encounter (Signed)
Reaching out to patient regarding medications for her Cardiac CT scan.  Explain to patient that after consultation with Dr. Audie Box, patient was called in a prescription of a one time dose of metoprolol tartrate for patient to take 2 hours prior to cardiac CT.  Patient verified pharmacy and verbalized understanding. She states that there was no need to review the instructions again.  Patient was encouraged to call back if she needed anything.

## 2020-12-03 ENCOUNTER — Other Ambulatory Visit: Payer: Self-pay

## 2020-12-03 ENCOUNTER — Ambulatory Visit (HOSPITAL_COMMUNITY)
Admission: RE | Admit: 2020-12-03 | Discharge: 2020-12-03 | Disposition: A | Discharge status: Home / Self Care | Payer: Commercial Managed Care - PPO | Source: Ambulatory Visit | Attending: Internal Medicine | Admitting: Internal Medicine

## 2020-12-03 DIAGNOSIS — I4819 Other persistent atrial fibrillation: Secondary | ICD-10-CM

## 2020-12-03 MED ORDER — DILTIAZEM HCL 25 MG/5ML IV SOLN
INTRAVENOUS | Status: AC
  Administered 2020-12-03: 10 mg via INTRAVENOUS
  Filled 2020-12-03: qty 5

## 2020-12-03 MED ORDER — DILTIAZEM HCL 25 MG/5ML IV SOLN: 10.0000 mg | Freq: Once | INTRAVENOUS | Status: AC

## 2020-12-03 MED ORDER — IOHEXOL 350 MG/ML SOLN
100.0000 mL | Freq: Once | INTRAVENOUS | Status: AC | PRN
  Administered 2020-12-03: 100 mL via INTRAVENOUS

## 2020-12-06 ENCOUNTER — Telehealth: Payer: Self-pay | Admitting: Internal Medicine

## 2020-12-06 NOTE — Telephone Encounter (Signed)
8 mm nodule in the left lower lobe. Recommends non contrast CT in 6-12 months. If stable future recommendation, 18 to 24 months for repeat scan.

## 2020-12-06 NOTE — Telephone Encounter (Signed)
[facility] is calling with a critical result

## 2020-12-06 NOTE — Anesthesia Preprocedure Evaluation (Addendum)
Anesthesia Evaluation  Patient identified by MRN, date of birth, ID band Patient awake    Reviewed: Allergy & Precautions, NPO status , Patient's Chart, lab work & pertinent test results  Airway Mallampati: II  TM Distance: >3 FB Neck ROM: Full    Dental  (+) Teeth Intact   Pulmonary neg pulmonary ROS,    Pulmonary exam normal        Cardiovascular hypertension, Pt. on home beta blockers and Pt. on medications + dysrhythmias Atrial Fibrillation  Rhythm:Irregular Rate:Normal     Neuro/Psych negative neurological ROS  negative psych ROS   GI/Hepatic Neg liver ROS, GERD  Medicated,  Endo/Other  Hypothyroidism   Renal/GU negative Renal ROS  negative genitourinary   Musculoskeletal negative musculoskeletal ROS (+)   Abdominal (+)  Abdomen: soft. Bowel sounds: normal.  Peds  Hematology negative hematology ROS (+)   Anesthesia Other Findings   Reproductive/Obstetrics                            Anesthesia Physical Anesthesia Plan  ASA: 3  Anesthesia Plan: General   Post-op Pain Management:    Induction: Intravenous  PONV Risk Score and Plan: 3 and Ondansetron, Dexamethasone, Midazolam and Treatment may vary due to age or medical condition  Airway Management Planned: Mask and Oral ETT  Additional Equipment: None  Intra-op Plan:   Post-operative Plan: Extubation in OR  Informed Consent: I have reviewed the patients History and Physical, chart, labs and discussed the procedure including the risks, benefits and alternatives for the proposed anesthesia with the patient or authorized representative who has indicated his/her understanding and acceptance.     Dental advisory given  Plan Discussed with: CRNA  Anesthesia Plan Comments: (Lab Results      Component                Value               Date                      WBC                      5.8                 11/17/2020                 HGB                      11.8                11/17/2020                HCT                      38.0                11/17/2020                MCV                      72 (L)              11/17/2020                PLT  387                 11/17/2020           Lab Results      Component                Value               Date                      NA                       138                 11/17/2020                K                        5.2                 11/17/2020                CO2                      20                  11/17/2020                GLUCOSE                  122 (H)             11/17/2020                BUN                      14                  11/17/2020                CREATININE               0.96                11/17/2020                CALCIUM                  9.4                 11/17/2020           ECHO 03/22: IMPRESSIONS  1. Left ventricular ejection fraction, by estimation, is 40 to 45%. The  left ventricle has normal function. The left ventricle demonstrates global  hypokinesis. Left ventricular diastolic parameters are indeterminate.  2. Right ventricular systolic function is low normal. The right  ventricular size is mildly enlarged.  3. Left atrial size was moderately dilated.  4. The mitral valve is normal in structure. Mild mitral valve  regurgitation. No evidence of mitral stenosis.  5. The aortic valve is tricuspid. Aortic valve regurgitation is trivial.  No aortic stenosis is present.  6. The inferior vena cava is normal in size with greater than 50%  respiratory variability, suggesting right atrial pressure of 3 mmHg. )       Anesthesia Quick Evaluation

## 2020-12-06 NOTE — Pre-Procedure Instructions (Signed)
Instructed patient on the following items: Arrival time 0530 Nothing to eat or drink after midnight No meds AM of procedure Responsible person to drive you home and stay with you for 24 hrs  Have you missed any doses of anti-coagulant Eliquis- hasn't missed any doses    

## 2020-12-07 ENCOUNTER — Other Ambulatory Visit: Payer: Self-pay

## 2020-12-07 ENCOUNTER — Ambulatory Visit (HOSPITAL_COMMUNITY): Payer: Commercial Managed Care - PPO | Admitting: Anesthesiology

## 2020-12-07 ENCOUNTER — Encounter (HOSPITAL_COMMUNITY)
Admission: RE | Disposition: A | Discharge status: Home / Self Care | Payer: Commercial Managed Care - PPO | Source: Home / Self Care | Attending: Internal Medicine

## 2020-12-07 ENCOUNTER — Encounter (HOSPITAL_COMMUNITY): Payer: Self-pay | Admitting: Internal Medicine

## 2020-12-07 ENCOUNTER — Ambulatory Visit (HOSPITAL_COMMUNITY)
Admission: RE | Admit: 2020-12-07 | Discharge: 2020-12-07 | Disposition: A | Discharge status: Home / Self Care | Payer: Commercial Managed Care - PPO | Attending: Internal Medicine | Admitting: Internal Medicine

## 2020-12-07 DIAGNOSIS — Z79899 Other long term (current) drug therapy: Secondary | ICD-10-CM | POA: Insufficient documentation

## 2020-12-07 DIAGNOSIS — I4819 Other persistent atrial fibrillation: Secondary | ICD-10-CM | POA: Diagnosis not present

## 2020-12-07 DIAGNOSIS — Z7901 Long term (current) use of anticoagulants: Secondary | ICD-10-CM | POA: Insufficient documentation

## 2020-12-07 DIAGNOSIS — Z79890 Hormone replacement therapy (postmenopausal): Secondary | ICD-10-CM | POA: Insufficient documentation

## 2020-12-07 DIAGNOSIS — Z7952 Long term (current) use of systemic steroids: Secondary | ICD-10-CM | POA: Diagnosis not present

## 2020-12-07 HISTORY — PX: ATRIAL FIBRILLATION ABLATION: EP1191

## 2020-12-07 LAB — POCT ACTIVATED CLOTTING TIME
Activated Clotting Time: 254 seconds
Activated Clotting Time: 306 seconds

## 2020-12-07 SURGERY — ATRIAL FIBRILLATION ABLATION
Anesthesia: General

## 2020-12-07 MED ORDER — APIXABAN 5 MG PO TABS
5.0000 mg | ORAL_TABLET | Freq: Once | ORAL | Status: AC
  Administered 2020-12-07: 5 mg via ORAL
  Filled 2020-12-07: qty 1

## 2020-12-07 MED ORDER — HYDROCODONE-ACETAMINOPHEN 5-325 MG PO TABS
1.0000 | ORAL_TABLET | ORAL | Status: DC | PRN
  Filled 2020-12-07: qty 2

## 2020-12-07 MED ORDER — FENTANYL CITRATE (PF) 100 MCG/2ML IJ SOLN
INTRAMUSCULAR | Status: DC | PRN
  Administered 2020-12-07: 100 ug via INTRAVENOUS

## 2020-12-07 MED ORDER — SODIUM CHLORIDE 0.9% FLUSH: 3.0000 mL | Freq: Two times a day (BID) | INTRAVENOUS | Status: DC

## 2020-12-07 MED ORDER — HEPARIN SODIUM (PORCINE) 1000 UNIT/ML IJ SOLN
INTRAMUSCULAR | Status: DC | PRN
  Administered 2020-12-07: 5000 [IU] via INTRAVENOUS
  Administered 2020-12-07: 2000 [IU] via INTRAVENOUS

## 2020-12-07 MED ORDER — SODIUM CHLORIDE 0.9% FLUSH: 3.0000 mL | INTRAVENOUS | Status: DC | PRN

## 2020-12-07 MED ORDER — SODIUM CHLORIDE 0.9 % IV SOLN: INTRAVENOUS | Status: DC

## 2020-12-07 MED ORDER — PROPOFOL 10 MG/ML IV BOLUS
INTRAVENOUS | Status: DC | PRN
  Administered 2020-12-07: 150 mg via INTRAVENOUS

## 2020-12-07 MED ORDER — ACETAMINOPHEN 325 MG PO TABS
650.0000 mg | ORAL_TABLET | ORAL | Status: DC | PRN
  Filled 2020-12-07: qty 2

## 2020-12-07 MED ORDER — HEPARIN SODIUM (PORCINE) 1000 UNIT/ML IJ SOLN
INTRAMUSCULAR | Status: DC | PRN
  Administered 2020-12-07: 15000 [IU] via INTRAVENOUS
  Administered 2020-12-07: 1000 [IU] via INTRAVENOUS

## 2020-12-07 MED ORDER — SODIUM CHLORIDE 0.9 % IV SOLN: 250.0000 mL | INTRAVENOUS | Status: DC | PRN

## 2020-12-07 MED ORDER — HEPARIN (PORCINE) IN NACL 1000-0.9 UT/500ML-% IV SOLN
INTRAVENOUS | Status: AC
  Filled 2020-12-07: qty 500

## 2020-12-07 MED ORDER — PHENYLEPHRINE HCL-NACL 10-0.9 MG/250ML-% IV SOLN
INTRAVENOUS | Status: DC | PRN
  Administered 2020-12-07: 40 ug/min via INTRAVENOUS

## 2020-12-07 MED ORDER — PROTAMINE SULFATE 10 MG/ML IV SOLN
INTRAVENOUS | Status: DC | PRN
  Administered 2020-12-07: 30 mg via INTRAVENOUS
  Administered 2020-12-07: 10 mg via INTRAVENOUS

## 2020-12-07 MED ORDER — HEPARIN (PORCINE) IN NACL 1000-0.9 UT/500ML-% IV SOLN
INTRAVENOUS | Status: DC | PRN
  Administered 2020-12-07: 500 mL

## 2020-12-07 MED ORDER — ROCURONIUM BROMIDE 10 MG/ML (PF) SYRINGE
PREFILLED_SYRINGE | INTRAVENOUS | Status: DC | PRN
  Administered 2020-12-07 (×2): 50 mg via INTRAVENOUS

## 2020-12-07 MED ORDER — SUGAMMADEX SODIUM 200 MG/2ML IV SOLN
INTRAVENOUS | Status: DC | PRN
  Administered 2020-12-07: 200 mg via INTRAVENOUS

## 2020-12-07 MED ORDER — ONDANSETRON HCL 4 MG/2ML IJ SOLN: 4.0000 mg | Freq: Four times a day (QID) | INTRAMUSCULAR | Status: DC | PRN

## 2020-12-07 MED ORDER — MIDAZOLAM HCL 5 MG/5ML IJ SOLN
INTRAMUSCULAR | Status: DC | PRN
  Administered 2020-12-07: 2 mg via INTRAVENOUS

## 2020-12-07 MED ORDER — LIDOCAINE 2% (20 MG/ML) 5 ML SYRINGE
INTRAMUSCULAR | Status: DC | PRN
  Administered 2020-12-07: 60 mg via INTRAVENOUS

## 2020-12-07 MED ORDER — HEPARIN SODIUM (PORCINE) 1000 UNIT/ML IJ SOLN
INTRAMUSCULAR | Status: AC
  Filled 2020-12-07: qty 2

## 2020-12-07 MED ORDER — PHENYLEPHRINE 40 MCG/ML (10ML) SYRINGE FOR IV PUSH (FOR BLOOD PRESSURE SUPPORT)
PREFILLED_SYRINGE | INTRAVENOUS | Status: DC | PRN
  Administered 2020-12-07 (×4): 80 ug via INTRAVENOUS

## 2020-12-07 MED ORDER — DEXAMETHASONE SODIUM PHOSPHATE 10 MG/ML IJ SOLN
INTRAMUSCULAR | Status: DC | PRN
  Administered 2020-12-07: 10 mg via INTRAVENOUS

## 2020-12-07 SURGICAL SUPPLY — 19 items
BLANKET WARM UNDERBOD FULL ACC (MISCELLANEOUS) ×2 IMPLANT
CATH 8FR REPROCESSED SOUNDSTAR (CATHETERS) ×2 IMPLANT
CATH ACUNAV REPROCESSED (CATHETERS) IMPLANT
CATH MAPPNG PENTARAY F 2-6-2MM (CATHETERS) ×1 IMPLANT
CATH SMTCH THERMOCOOL SF DF (CATHETERS) ×2 IMPLANT
CATH WEBSTER BI DIR CS D-F CRV (CATHETERS) ×2 IMPLANT
CLOSURE PERCLOSE PROSTYLE (VASCULAR PRODUCTS) ×6 IMPLANT
COVER SWIFTLINK CONNECTOR (BAG) ×2 IMPLANT
NEEDLE BAYLIS TRANSSEPTAL 71CM (NEEDLE) ×2 IMPLANT
PACK EP LATEX FREE (CUSTOM PROCEDURE TRAY) ×2
PACK EP LF (CUSTOM PROCEDURE TRAY) ×1 IMPLANT
PAD PRO RADIOLUCENT 2001M-C (PAD) ×2 IMPLANT
PATCH CARTO3 (PAD) ×2 IMPLANT
PENTARAY F 2-6-2MM (CATHETERS) ×2
SHEATH PINNACLE 7F 10CM (SHEATH) ×4 IMPLANT
SHEATH PINNACLE 9F 10CM (SHEATH) ×2 IMPLANT
SHEATH PROBE COVER 6X72 (BAG) ×2 IMPLANT
SHEATH SWARTZ TS SL2 63CM 8.5F (SHEATH) ×2 IMPLANT
TUBING SMART ABLATE COOLFLOW (TUBING) ×2 IMPLANT

## 2020-12-07 NOTE — Discharge Instructions (Addendum)
Post procedure care instructions No driving for 4 days. No lifting over 5 lbs for 1 week. No vigorous or sexual activity for 1 week. You may return to work/your usual activities on 12/15/20. Keep procedure site clean & dry. If you notice increased pain, swelling, bleeding or pus, call/return!  You may shower after 24 hours, but no soaking in baths/hot tubs/pools for 1 week.    Cardiac Ablation, Care After  This sheet gives you information about how to care for yourself after your procedure. Your health care provider may also give you more specific instructions. If you have problems or questions, contact your health care provider. What can I expect after the procedure? After the procedure, it is common to have: Bruising around your puncture site. Tenderness around your puncture site. Skipped heartbeats. Tiredness (fatigue).  Follow these instructions at home: Puncture site care  Follow instructions from your health care provider about how to take care of your puncture site. Make sure you: If present, leave stitches (sutures), skin glue, or adhesive strips in place. These skin closures may need to stay in place for up to 2 weeks. If adhesive strip edges start to loosen and curl up, you may trim the loose edges. Do not remove adhesive strips completely unless your health care provider tells you to do that. If a square bandage is present, this may be removed in 24 hours.  Check your puncture site every day for signs of infection. Check for: Redness, swelling, or pain. Fluid or blood. If your puncture site starts to bleed, lie down on your back, apply firm pressure to the area, and contact your health care provider. Warmth. Pus or a bad smell. Driving Do not drive for at least 4 days after your procedure or however long your health care provider recommends. (Do not resume driving if you have previously been instructed not to drive for other health reasons.) Do not drive or use heavy machinery  while taking prescription pain medicine. Activity Avoid activities that take a lot of effort for at least 7 days after your procedure. Do not lift anything that is heavier than 5 lb (4.5 kg) for one week.  No sexual activity for 1 week.  Return to your normal activities as told by your health care provider. Ask your health care provider what activities are safe for you. General instructions Take over-the-counter and prescription medicines only as told by your health care provider. Do not use any products that contain nicotine or tobacco, such as cigarettes and e-cigarettes. If you need help quitting, ask your health care provider. You may shower after 24 hours, but Do not take baths, swim, or use a hot tub for 1 week.  Do not drink alcohol for 24 hours after your procedure. Keep all follow-up visits as told by your health care provider. This is important. Contact a health care provider if: You have redness, mild swelling, or pain around your puncture site. You have fluid or blood coming from your puncture site that stops after applying firm pressure to the area. Your puncture site feels warm to the touch. You have pus or a bad smell coming from your puncture site. You have a fever. You have chest pain or discomfort that spreads to your neck, jaw, or arm. You are sweating a lot. You feel nauseous. You have a fast or irregular heartbeat. You have shortness of breath. You are dizzy or light-headed and feel the need to lie down. You have pain or numbness in the  arm or leg closest to your puncture site. Get help right away if: Your puncture site suddenly swells. Your puncture site is bleeding and the bleeding does not stop after applying firm pressure to the area. These symptoms may represent a serious problem that is an emergency. Do not wait to see if the symptoms will go away. Get medical help right away. Call your local emergency services (911 in the [place] Do not drive yourself to the  hospital. Summary After the procedure, it is normal to have bruising and tenderness at the puncture site in your groin, neck, or forearm. Check your puncture site every day for signs of infection. Get help right away if your puncture site is bleeding and the bleeding does not stop after applying firm pressure to the area. This is a medical emergency. This information is not intended to replace advice given to you by your health care provider. Make sure you discuss any questions you have with your health care provider.   You have an appointment set up with the [facility].  Multiple studies have shown that being followed by a dedicated atrial fibrillation clinic in addition to the standard care you receive from your other physicians improves health. We believe that enrollment in the atrial fibrillation clinic will allow [place] to better care for you.   The phone number to the [facility] is [phone]. The clinic is staffed Monday through Friday from 8:30am to 5pm.  Parking Directions: The clinic is located in the Heart and Vascular Building connected to [facility]. 1)From [address] turn on to [address] and go to the 3rd entrance  (Heart and Vascular entrance) on the right. 2)Look to the right for Heart &Vascular Parking Garage. 3)A code for the entrance is required, for August is 5544.   4)Take the elevators to the 1st floor. Registration is in the room with the glass walls at the end of the hallway.  If you have any trouble parking or locating the clinic, please don't hesitate to call [phone].

## 2020-12-07 NOTE — Transfer of Care (Signed)
Immediate Anesthesia Transfer of Care Note  Patient: Bethany Blankenship  Procedure(s) Performed: ATRIAL FIBRILLATION ABLATION  Patient Location: PACU  Anesthesia Type:General  Level of Consciousness: drowsy  Airway & Oxygen Therapy: Patient Spontanous Breathing  Post-op Assessment: Report given to RN and Post -op Vital signs reviewed and stable  Post vital signs: Reviewed and stable  Last Vitals:  Vitals Value Taken Time  BP    Temp    Pulse 61 12/07/20 1008  Resp 22 12/07/20 1008  SpO2 93 % 12/07/20 1008  Vitals shown include unvalidated device data.  Last Pain:  Vitals:   12/07/20 0612  TempSrc: Oral  PainSc:       Patients Stated Pain Goal: 4 ([id number] [phone]  Complications: No notable events documented.

## 2020-12-07 NOTE — H&P (Signed)
PCP:  Manon Hilding, MD      Cardiologist:  Dr Domenic Polite Primary Electrophysiologist: Thompson Grayer, MD        CC: afib   History of Present Illness: Bethany Blankenship is a 60 y.o. female who presents today for electrophysiology study and ablation of afib.       The patient has had afib for about 10 years.  This began in the setting of thyroid storm requiriing RAI previously.  Her EF is 40%  She has failed medical therapy with propafenone.   + fatigue and decreased exercise tolerance with afib. Today, she denies symptoms of palpitations, chest pain, shortness of breath, orthopnea, PND, lower extremity edema, claudication, dizziness, presyncope, syncope, bleeding, or neurologic sequela. The patient is tolerating medications without difficulties and is otherwise without complaint today.         Past Medical History:  Diagnosis Date   Chronic systolic dysfunction of left ventricle     GERD (gastroesophageal reflux disease)     Graves' disease with thyrotoxic storm      Status post RAI   Hypothyroidism     Persistent atrial fibrillation (HCC)           Past Surgical History:  Procedure Laterality Date   NO PAST SURGERIES                  Current Outpatient Medications  Medication Sig Dispense Refill   ELIQUIS 5 MG TABS tablet Take 1 tablet by mouth twice daily 300 tablet 0   ENTRESTO 24-26 MG Take 1 tablet by mouth twice daily 180 tablet 2   fluticasone (FLONASE) 50 MCG/ACT nasal spray Place into both nostrils as needed.       levothyroxine (SYNTHROID) 137 MCG tablet As directed. 90 tablet 3   metoprolol succinate (TOPROL-XL) 25 MG 24 hr tablet TAKE 1 TABLET BY MOUTH DAILY 90 tablet 1   omeprazole (PRILOSEC) 40 MG capsule Take 40 mg by mouth daily.       Cephalexin 500 MG tablet Take 500 mg by mouth 3 (three) times daily.       predniSONE (DELTASONE) 20 MG tablet Take by mouth. Use as directed       triamcinolone cream (KENALOG) 0.1 % Use as directed        No current  facility-administered medications for this visit.      Allergies:   Patient has no known allergies.    Social History:  The patient  reports that she has never smoked. She has never used smokeless tobacco. She reports that she does not drink alcohol and does not use drugs.    Family History:  The patient's family history includes CAD in her father.      ROS:  Please see the history of present illness.   All other systems are personally reviewed and negative.     PHYSICAL EXAM: Vitals:   12/07/20 0612  BP: 126/71  Pulse: 97  Temp: 98 F (36.7 C)  SpO2: 100%    GEN: Well nourished, well developed, in no acute distress HEENT: normal Neck: no JVD, carotid bruits, or masses Cardiac: iRRR; no murmurs, rubs, or gallops,no edema Respiratory:  clear to auscultation bilaterally, normal work of breathing GI: soft, nontender, nondistended, + BS MS: no deformity or atrophy Skin: warm and dry Neuro:  Strength and sensation are intact Psych: euthymic mood, full affect        Other studies personally reviewed: Additional studies/ records that were reviewed today  include: AF clinic notes, prior echo  Review of the above records today demonstrates: as above   Echo 08/11/20- EF 40-45% Moderate LA enlargement     ASSESSMENT AND PLAN:   1.  Persistent afib The patient has symptomatic, recurrent atrial fibrillation. she has failed medical therapy with propafenone. Chads2vasc score is at least 2.  she is anticoagulated with eliquis .   Risk, benefits, and alternatives to EP study and radiofrequency ablation for afib were again discussed in detail today. These risks include but are not limited to stroke, bleeding, vascular damage, tamponade, perforation, damage to the esophagus, lungs, and other structures, pulmonary vein stenosis, worsening renal function, and death. The patient understands these risk and wishes to proceed.    Cardiac CT reviewed at length with the patient  today.  she reports compliance with [place] without interruption.  Thompson Grayer MD, [place] 12/07/2020 7:26 AM

## 2020-12-07 NOTE — Anesthesia Postprocedure Evaluation (Signed)
Anesthesia Post Note  Patient: Bethany Blankenship  Procedure(s) Performed: ATRIAL FIBRILLATION ABLATION     Patient location during evaluation: PACU Anesthesia Type: General Level of consciousness: awake Pain management: pain level controlled Vital Signs Assessment: post-procedure vital signs reviewed and stable Respiratory status: spontaneous breathing Cardiovascular status: stable Postop Assessment: no apparent nausea or vomiting Anesthetic complications: no   No notable events documented.  Last Vitals:  Vitals:   12/07/20 1110 12/07/20 1130  BP: (!) 105/54 (!) 97/55  Pulse: (!) 52 (!) 54  Resp: 20 12  Temp:    SpO2: 97% 99%    Last Pain:  Vitals:   12/07/20 1111  TempSrc:   PainSc: 0-No pain                 Aquila Delaughter

## 2020-12-07 NOTE — Anesthesia Procedure Notes (Addendum)
Procedure Name: Intubation Date/Time: 12/07/2020 7:48 AM Performed by: Donnelly Angelica, RN Pre-anesthesia Checklist: Patient identified, Emergency Drugs available, Suction available and Patient being monitored Patient Re-evaluated:Patient Re-evaluated prior to induction Oxygen Delivery Method: Circle System Utilized Preoxygenation: Pre-oxygenation with 100% oxygen Induction Type: IV induction Ventilation: Mask ventilation without difficulty Laryngoscope Size: Mac and 3 Grade View: Grade II Tube type: Oral Tube size: 7.0 mm Number of attempts: 1 Airway Equipment and Method: Stylet and Oral airway Placement Confirmation: ETT inserted through vocal cords under direct vision, positive ETCO2 and breath sounds checked- equal and bilateral Secured at: 22 cm Tube secured with: Tape Dental Injury: Teeth and Oropharynx as per pre-operative assessment  Comments: Intubation by S. Lovena Le, [place]

## 2020-12-08 ENCOUNTER — Encounter: Payer: Self-pay | Admitting: Cardiology

## 2020-12-08 ENCOUNTER — Ambulatory Visit (INDEPENDENT_AMBULATORY_CARE_PROVIDER_SITE_OTHER): Payer: Commercial Managed Care - PPO | Admitting: Cardiology

## 2020-12-08 VITALS — BP 114/72 | HR 52 | Ht 65.0 in | Wt 167.0 lb

## 2020-12-08 DIAGNOSIS — I429 Cardiomyopathy, unspecified: Secondary | ICD-10-CM

## 2020-12-08 DIAGNOSIS — I4819 Other persistent atrial fibrillation: Secondary | ICD-10-CM | POA: Diagnosis not present

## 2020-12-08 NOTE — Progress Notes (Signed)
Cardiology Office Note  Date: 12/08/2020   ID: Bethany Blankenship, DOB 1960/06/29, MRN [id number]  PCP:  Manon Hilding, MD  Cardiologist:  Rozann Lesches, MD Electrophysiologist:  None   Chief Complaint  Patient presents with   Cardiac follow-up     History of Present Illness: Bethany Blankenship is a 60 y.o. female last seen in December 2021.  She has had interval follow-up in the atrial fibrillation clinic and also with Dr. Rayann Heman.  Yesterday in fact she underwent atrial fibrillation ablation at [facility] with Dr. Rayann Heman and was in sinus rhythm at the completion of the procedure per review of notes.  She was continued on Eliquis at discharge.  She has no complaints today, pleased with how things went yesterday.  She has follow-up in the atrial fibrillation clinic in about 4 weeks.  I personally reviewed her ECG today which shows sinus bradycardia.  We went over her medications, plan to continue Toprol-XL and Entresto for now.  We will reassess echocardiogram in about 8 weeks.  Past Medical History:  Diagnosis Date   Chronic systolic dysfunction of left ventricle    GERD (gastroesophageal reflux disease)    Graves' disease with thyrotoxic storm    Status post RAI   Hypothyroidism    Persistent atrial fibrillation ([place])     Past Surgical History:  Procedure Laterality Date   ATRIAL FIBRILLATION ABLATION N/A 12/07/2020   Procedure: ATRIAL FIBRILLATION ABLATION;  Surgeon: Thompson Grayer, MD;  Location: [facility];  Service: Cardiovascular;  Laterality: N/A;   NO PAST SURGERIES      Current Outpatient Medications  Medication Sig Dispense Refill   ELIQUIS 5 MG TABS tablet Take 1 tablet by mouth twice daily (Patient taking differently: Take 5 mg by mouth 2 (two) times daily.) 300 tablet 0   ENTRESTO 24-26 MG Take 1 tablet by mouth twice daily (Patient taking differently: Take 1 tablet by mouth 2 (two) times daily.) 180 tablet 2   fluticasone (FLONASE) 50 MCG/ACT nasal spray  Place 1-2 sprays into both nostrils daily as needed for allergies.     levothyroxine (SYNTHROID) 112 MCG tablet TAKE 1 TABLET EVERY DAY AS DIRECTED (Patient taking differently: Take 112 mcg by mouth in the morning. TAKE 1 TABLET EVERY DAY AS DIRECTED) 90 tablet 1   metoprolol succinate (TOPROL-XL) 50 MG 24 hr tablet Take 1 tablet (50 mg total) by mouth daily. Take with or immediately following a meal. (Patient taking differently: Take 50 mg by mouth in the morning. Take with or immediately following a meal.) 90 tablet 3   omeprazole (PRILOSEC) 40 MG capsule Take 40 mg by mouth in the morning.     No current facility-administered medications for this visit.   Allergies:  Patient has no known allergies.   ROS: No syncope.  Physical Exam: VS:  BP 114/72   Pulse (!) 52   Ht '5\' 5"'$  (1.651 m)   Wt 167 lb (75.8 kg)   SpO2 99%   BMI 27.79 kg/m , BMI Body mass index is 27.79 kg/m.  Wt Readings from Last 3 Encounters:  12/08/20 167 lb (75.8 kg)  12/07/20 165 lb (74.8 kg)  11/03/20 164 lb (74.4 kg)    General: Patient appears comfortable at rest. HEENT: Conjunctiva and lids normal, wearing a mask. Neck: Supple, no elevated JVP or carotid bruits, no thyromegaly. Lungs: Clear to auscultation, nonlabored breathing at rest. Cardiac: Regular rate and rhythm, no S3 or significant systolic murmur, no  pericardial rub. Extremities: No pitting edema.  ECG:  An ECG dated 12/07/2020 was personally reviewed today and demonstrated:  Sinus rhythm with low voltage.  Recent Labwork: 09/08/2020: Magnesium 2.4 10/27/2020: TSH 0.017 11/17/2020: BUN 14; Creatinine, Ser 0.96; Hemoglobin 11.8; Platelets 387; Potassium 5.2; Sodium 138   Other Studies Reviewed Today:  Echocardiogram 08/11/2020:  1. Left ventricular ejection fraction, by estimation, is 40 to 45%. The  left ventricle has normal function. The left ventricle demonstrates global  hypokinesis. Left ventricular diastolic parameters are indeterminate.    2. Right ventricular systolic function is low normal. The right  ventricular size is mildly enlarged.   3. Left atrial size was moderately dilated.   4. The mitral valve is normal in structure. Mild mitral valve  regurgitation. No evidence of mitral stenosis.   5. The aortic valve is tricuspid. Aortic valve regurgitation is trivial.  No aortic stenosis is present.   6. The inferior vena cava is normal in size with greater than 50%  respiratory variability, suggesting right atrial pressure of 3 mmHg.   Assessment and Plan:  1.  Persistent atrial fibrillation/flutter with CHA2DS2-VASc score of 2.  She underwent successful atrial fibrillation ablation yesterday with Dr. Rayann Heman and is in sinus rhythm today.  Presently not on any antiarrhythmic therapy with continuation of Eliquis.  She has follow-up in the atrial fibrillation clinic in about 4 weeks.  2.  Secondary cardiomyopathy.  Continue Toprol-XL and Entresto.  Last LVEF improved to the range of 40 to 45% in March.  If she maintains sinus rhythm, we will plan on rechecking echocardiogram in 8 weeks with clinical follow-up and then adjust medications as necessary.  Medication Adjustments/Labs and Tests Ordered: Current medicines are reviewed at length with the patient today.  Concerns regarding medicines are outlined above.   Tests Ordered: Orders Placed This Encounter  Procedures   EKG 12-Lead    Medication Changes: No orders of the defined types were placed in this encounter.   Disposition:  Follow up  2 months.  Signed, Satira Sark, MD, [facility] 12/08/2020 10:27 AM    [facility] [place], [place], [place] [address] Phone: [phone]; Fax: [phone]

## 2020-12-08 NOTE — Patient Instructions (Signed)
Medication Instructions:  Your physician recommends that you continue on your current medications as directed. Please refer to the Current Medication list given to you today.  Labwork: none  Testing/Procedures: Your physician has requested that you have an echocardiogram in 2 months (September 2022) just before your next visit. Echocardiography is a painless test that uses sound waves to create images of your heart. It provides your doctor with information about the size and shape of your heart and how well your heart's chambers and valves are working. This procedure takes approximately one hour. There are no restrictions for this procedure.  Follow-Up: Your physician recommends that you schedule a follow-up appointment in: 2 months  Any Other Special Instructions Will Be Listed Below (If Applicable).  If you need a refill on your cardiac medications before your next appointment, please call your pharmacy.

## 2020-12-16 ENCOUNTER — Other Ambulatory Visit: Payer: Self-pay | Admitting: *Deleted

## 2020-12-16 MED ORDER — APIXABAN 5 MG PO TABS: 5.0000 mg | ORAL_TABLET | Freq: Two times a day (BID) | ORAL | 3 refills | Status: DC

## 2020-12-18 NOTE — Telephone Encounter (Signed)
Discussed with patient at time of ablation. I have advised that she follow-up with primary care to arrange follow-up CT in 6-12 months.

## 2021-01-05 ENCOUNTER — Other Ambulatory Visit: Payer: Self-pay

## 2021-01-05 ENCOUNTER — Encounter (HOSPITAL_COMMUNITY): Payer: Self-pay | Admitting: Physician Assistant

## 2021-01-05 ENCOUNTER — Ambulatory Visit (HOSPITAL_COMMUNITY)
Admission: RE | Admit: 2021-01-05 | Discharge: 2021-01-05 | Disposition: A | Discharge status: Home / Self Care | Payer: Commercial Managed Care - PPO | Source: Ambulatory Visit | Attending: Physician Assistant | Admitting: Physician Assistant

## 2021-01-05 VITALS — BP 132/80 | HR 51 | Ht 65.0 in | Wt 165.6 lb

## 2021-01-05 DIAGNOSIS — Z79899 Other long term (current) drug therapy: Secondary | ICD-10-CM | POA: Insufficient documentation

## 2021-01-05 DIAGNOSIS — Z7901 Long term (current) use of anticoagulants: Secondary | ICD-10-CM | POA: Insufficient documentation

## 2021-01-05 DIAGNOSIS — I4892 Unspecified atrial flutter: Secondary | ICD-10-CM | POA: Diagnosis not present

## 2021-01-05 DIAGNOSIS — Z8249 Family history of ischemic heart disease and other diseases of the circulatory system: Secondary | ICD-10-CM | POA: Diagnosis not present

## 2021-01-05 DIAGNOSIS — I4819 Other persistent atrial fibrillation: Secondary | ICD-10-CM | POA: Diagnosis present

## 2021-01-05 DIAGNOSIS — E0500 Thyrotoxicosis with diffuse goiter without thyrotoxic crisis or storm: Secondary | ICD-10-CM | POA: Diagnosis not present

## 2021-01-05 DIAGNOSIS — I5022 Chronic systolic (congestive) heart failure: Secondary | ICD-10-CM | POA: Diagnosis not present

## 2021-01-05 DIAGNOSIS — Z79890 Hormone replacement therapy (postmenopausal): Secondary | ICD-10-CM | POA: Diagnosis not present

## 2021-01-05 NOTE — Progress Notes (Signed)
Primary Care Physician: Manon Hilding, MD Primary Cardiologist: Dr Domenic Polite Primary Electrophysiologist: Dr Rayann Heman Referring Physician: Dr Tresa Res Bethany Blankenship is a 60 y.o. 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  The patient was initially diagnosed with atrial fibrillation remotely and had been maintained on propafenone. Patient is on Eliquis for a CHADS2VASC score of 2. On Echo 01/2020 her EF was decreased at 30-35%. Her rate control was increased and she was started on Entresto. Echo 08/11/20 showed her EF had improved 40-45%. She is fairly asymptomatic with her afib. She denies significant snoring or alcohol use.   On follow up today, patient is s/p afib ablation with Dr Rayann Heman on 12/07/20. She reports that she has done well since the procedure. She has had a few brief episodes of palpitations. She is in SR today. She denies CP, swallowing pain, or groin issues.   Today, she denies symptoms of chest pain, shortness of breath, orthopnea, PND, lower extremity edema, dizziness, presyncope, syncope, snoring, daytime somnolence, bleeding, or neurologic sequela. The patient is tolerating medications without difficulties and is otherwise without complaint today.    Atrial Fibrillation Risk Factors:  she does not have symptoms or diagnosis of sleep apnea.. she does not have a history of rheumatic fever. she does not have a history of alcohol use. The patient does not have a history of early familial atrial fibrillation or other arrhythmias.  she has a BMI of Body mass index is 27.56 kg/m.Marland Kitchen Filed Weights   01/05/21 1035  Weight: 75.1 kg    Family History  Problem Relation Age of Onset   CAD Father      Atrial Fibrillation Management history:  Previous antiarrhythmic drugs: propafenone  Previous cardioversions: 2017 x 2 Previous  ablations: none CHADS2VASC score: 2 Anticoagulation history: Eliquis   Past Medical History:  Diagnosis Date   Chronic systolic dysfunction of left ventricle    GERD (gastroesophageal reflux disease)    Graves' disease with thyrotoxic storm    Status post RAI   Hypothyroidism    Persistent atrial fibrillation ([place])    Past Surgical History:  Procedure Laterality Date   ATRIAL FIBRILLATION ABLATION N/A 12/07/2020   Procedure: ATRIAL FIBRILLATION ABLATION;  Surgeon: Thompson Grayer, MD;  Location: [facility];  Service: Cardiovascular;  Laterality: N/A;   NO PAST SURGERIES      Current Outpatient Medications  Medication Sig Dispense Refill   apixaban (ELIQUIS) 5 MG TABS tablet Take 1 tablet (5 mg total) by mouth 2 (two) times daily. 180 tablet 3   ENTRESTO 24-26 MG Take 1 tablet by mouth twice daily 180 tablet 2   fluticasone (FLONASE) 50 MCG/ACT nasal spray Place 1-2 sprays into both nostrils daily as needed for allergies.     levothyroxine (SYNTHROID) 112 MCG tablet TAKE 1 TABLET EVERY DAY AS DIRECTED 90 tablet 1   metoprolol succinate (TOPROL-XL) 50 MG 24 hr tablet Take 1 tablet (50 mg total) by mouth daily. Take with or immediately following a meal. 90 tablet 3   omeprazole (PRILOSEC) 40 MG capsule Take 40 mg by mouth in the morning.     No current facility-administered medications for this encounter.    No Known Allergies  Social History   Socioeconomic History   Marital status: Married    Spouse name: Not on file   Number of children: Not  on file   Years of education: Not on file   Highest education level: Not on file  Occupational History   Not on file  Tobacco Use   Smoking status: Never   Smokeless tobacco: Never  Vaping Use   Vaping Use: Never used  Substance and Sexual Activity   Alcohol use: No    Alcohol/week: 0.0 standard drinks   Drug use: No   Sexual activity: Not on file  Other Topics Concern   Not on file  Social History Narrative   Lives  in [place] with spouse.     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  Physical Exam: Vitals:   01/05/21 1035  BP: 132/80  Pulse: (!) 51  Weight: 75.1 kg  Height: '5\' 5"'$  (1.651 m)   GEN- The patient is a well appearing female, alert and oriented x 3 today.   HEENT-head normocephalic, atraumatic, sclera clear, conjunctiva pink, hearing intact, trachea midline. Lungs- Clear to ausculation bilaterally, normal work of breathing Heart- Regular rate and rhythm, no murmurs, rubs or gallops  GI- soft, NT, ND, + BS Extremities- no clubbing, cyanosis, or edema MS- no significant deformity or atrophy Skin- no rash or lesion Psych- euthymic mood, full affect Neuro- strength and sensation are intact   Wt Readings from Last 3 Encounters:  01/05/21 75.1 kg  12/08/20 75.8 kg  12/07/20 74.8 kg    EKG today demonstrates  SB Vent. rate 51 BPM PR interval 136 ms QRS duration 78 ms QT/QTcB 468/431 ms  Echo 08/11/20 demonstrated  1. Left ventricular ejection fraction, by estimation, is 40 to 45%. The  left ventricle has normal function. The left ventricle demonstrates global hypokinesis. Left ventricular diastolic parameters are indeterminate.   2. Right ventricular systolic function is low normal. The right  ventricular size is mildly enlarged.   3. Left atrial size was moderately dilated.   4. The mitral valve is normal in structure. Mild mitral valve  regurgitation. No evidence of mitral stenosis.   5. The aortic valve is tricuspid. Aortic valve regurgitation is trivial.  No aortic stenosis is  present.   6. The inferior vena cava is normal in size with greater than 50%  respiratory variability, suggesting right atrial pressure of 3 mmHg.   Comparison(s): Echocardiogram done 01/21/20 showed an Ef of 30-35%.   Epic records are reviewed at length today  CHA2DS2-VASc Score = 2  The patient's score is based upon: CHF History: Yes HTN History: No Diabetes History: No Stroke History: No Vascular Disease History: No Age Score: 0 Gender Score: 1      ASSESSMENT AND PLAN: 1. Persistent Atrial Fibrillation/atrial flutter The patient's CHA2DS2-VASc score is 2, indicating a 2.2% annual risk of stroke.   S/p afib ablation with Dr Rayann Heman 12/07/20 Reassured patient that some breakthrough episodes of afib are not uncommon for the first 3 months post ablation.  Continue Eliquis 5 mg BID Continue Toprol 50 mg daily  2. Chronic systolic CHF No signs or symptoms of fluid overload. Hopefully EF will improve post ablation.  Follow up with Dr Rayann Heman and Dr Domenic Polite as scheduled.    [facility] [address] [place], [place] [address] [phone] 01/05/2021 11:11 AM

## 2021-01-27 ENCOUNTER — Other Ambulatory Visit: Payer: Self-pay | Admitting: "Endocrinology

## 2021-01-27 LAB — T4, FREE: Free T4: 1.96 ng/dL — ABNORMAL HIGH (ref 0.82–1.77)

## 2021-01-27 LAB — TSH: TSH: 0.007 u[IU]/mL — ABNORMAL LOW (ref 0.450–4.500)

## 2021-01-27 MED ORDER — LEVOTHYROXINE SODIUM 100 MCG PO TABS: 100.0000 ug | ORAL_TABLET | Freq: Every day | ORAL | 2 refills | Status: DC

## 2021-02-04 ENCOUNTER — Ambulatory Visit: Payer: Commercial Managed Care - PPO | Admitting: "Endocrinology

## 2021-02-09 ENCOUNTER — Encounter: Payer: Self-pay | Admitting: "Endocrinology

## 2021-02-09 ENCOUNTER — Other Ambulatory Visit: Payer: Self-pay

## 2021-02-09 ENCOUNTER — Ambulatory Visit (INDEPENDENT_AMBULATORY_CARE_PROVIDER_SITE_OTHER): Payer: Commercial Managed Care - PPO | Admitting: "Endocrinology

## 2021-02-09 VITALS — BP 108/58 | HR 64 | Ht 65.0 in | Wt 164.6 lb

## 2021-02-09 DIAGNOSIS — E890 Postprocedural hypothyroidism: Secondary | ICD-10-CM

## 2021-02-09 MED ORDER — LEVOTHYROXINE SODIUM 75 MCG PO TABS: 75.0000 ug | ORAL_TABLET | Freq: Every day | ORAL | 2 refills | Status: DC

## 2021-02-09 NOTE — Progress Notes (Signed)
02/09/2021            Endocrinology follow-up note   HPI  Bethany Blankenship is a 60 y.o.-year-old female, she is being seen in follow-up of RAI induced hypothyroidism.  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 She is currently on levothyroxine 100 mcg p.o. daily before breakfast.      She presents with some weight loss, and her previsit thyroid function tests are consistent with over replacement.  She denies palpitations, tremors, nor heat intolerance at this time.     ROS: Limited as above. PE: BP (!) 108/58   Pulse 64   Ht _0  (1.651 m)   Wt 164 lb 9.6 oz (74.7 kg)   BMI 27.39 kg/m  Wt Readings from Last 3 Encounters:  02/09/21 164 lb 9.6 oz (74.7 kg)  01/05/21 165 lb 9.6 oz (75.1 kg)  12/08/20 167 lb (75.8 kg)    Physical Exam- Limited  Constitutional:  Body mass index is 27.39 kg/m. , not in acute distress, normal state of mind     Recent Results (from the past 2160 hour(s))  CBC w/Diff     Status: Abnormal   Collection Time: 11/17/20 11:49 AM  Result Value Ref Range   WBC 5.8 3.4 - 10.8 x10E3/uL   RBC 5.27 3.77 - 5.28 x10E6/uL   Hemoglobin 11.8 11.1 - 15.9 g/dL   Hematocrit 38.0 34.0 - 46.6 %   MCV 72 (L) 79 - 97 fL   MCH 22.4 (L) 26.6 - 33.0 pg   MCHC 31.1 (L) 31.5 - 35.7 g/dL   RDW 16.7 (H) 11.7 - 15.4 %   Platelets 387 150 - 450 x10E3/uL   Neutrophils 66 Not Estab. %   Lymphs 27 Not Estab. %   Monocytes 6 Not Estab. %   Eos 0 Not Estab. %   Basos 0 Not Estab. %   Neutrophils Absolute 3.9 1.4 - 7.0 x10E3/uL   Lymphocytes Absolute 1.6 0.7 - 3.1 x10E3/uL   Monocytes Absolute 0.3 0.1 - 0.9 x10E3/uL   EOS (ABSOLUTE) 0.0 0.0 - 0.4 x10E3/uL   Basophils Absolute 0.0 0.0 - 0.2 x10E3/uL   Immature Granulocytes 1 Not Estab. %   Immature Grans (Abs) 0.0 0.0 - 0.1 T61W4/RX  Basic Metabolic Panel (BMET)     Status: Abnormal   Collection Time: 11/17/20 11:49 AM  Result Value Ref Range   Glucose 122 (H) 65  - 99 mg/dL   BUN 14 8 - 27 mg/dL   Creatinine, Ser 0.96 0.57 - 1.00 mg/dL   eGFR 67 >59 mL/min/1.73   BUN/Creatinine Ratio 15 12 - 28   Sodium 138 134 - 144 mmol/L   Potassium 5.2 3.5 - 5.2 mmol/L   Chloride 105 96 - 106 mmol/L   CO2 20 20 - 29 mmol/L   Calcium 9.4 8.7 - 10.3 mg/dL  POCT Activated clotting time     Status: None   Collection Time: 12/07/20  8:55 AM  Result Value Ref Range   Activated Clotting Time 254 seconds  POCT Activated clotting time     Status: None   Collection Time: 12/07/20  9:20 AM  Result Value Ref Range   Activated Clotting Time 306 seconds  TSH     Status: Abnormal   Collection Time: 01/26/21 10:03 AM  Result Value Ref Range   TSH 0.007 (L) 0.450 - 4.500 uIU/mL  T4, free     Status: Abnormal   Collection  Time: 01/26/21 10:03 AM  Result Value Ref Range   Free T4 1.96 (H) 0.82 - 1.77 ng/dL    ASSESSMENT: 1. 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  I discussed and lowered her levothyroxine to 75 mcg p.o. daily before breakfast.    - We discussed about the correct intake of her thyroid hormone, on empty stomach at fasting, with water, separated by at least 30 minutes from breakfast and other medications,  and separated by more than 4 hours from calcium, iron, multivitamins, acid reflux medications (PPIs). -Patient is made aware of the fact that thyroid hormone replacement is needed for life, dose to be adjusted by periodic monitoring of thyroid function tests. She is advised to maintain close follow-up with her PMD for primary care needs.    -Patient may benefit from screening bone density during her annual physical at her PCP office.   I spent 21 minutes in the care of the patient today including review of labs from Thyroid Function, CMP, and other relevant labs ; imaging/biopsy records (current and previous including abstractions from other facilities);  face-to-face time discussing  her lab results and symptoms, medications doses, her options of short and long term treatment based on the latest standards of care / guidelines;   and documenting the encounter.  [name]  participated in the discussions, expressed understanding, and voiced agreement with the above plans.  All questions were answered to her satisfaction. she is encouraged to contact clinic should she have any questions or concerns prior to her return visit.    Glade Lloyd, MD Phone: [phone]  Fax: [phone]  -  This note was partially dictated with voice recognition software. Similar sounding words can be transcribed inadequately or may not  be corrected upon review.  02/09/2021, 1:34 PM

## 2021-02-16 ENCOUNTER — Other Ambulatory Visit (HOSPITAL_COMMUNITY): Payer: Commercial Managed Care - PPO

## 2021-02-16 ENCOUNTER — Ambulatory Visit (HOSPITAL_COMMUNITY): Admission: RE | Admit: 2021-02-16 | Payer: Commercial Managed Care - PPO | Source: Ambulatory Visit

## 2021-03-09 ENCOUNTER — Ambulatory Visit (INDEPENDENT_AMBULATORY_CARE_PROVIDER_SITE_OTHER): Payer: Commercial Managed Care - PPO | Admitting: Internal Medicine

## 2021-03-09 ENCOUNTER — Ambulatory Visit: Payer: Commercial Managed Care - PPO | Admitting: Cardiology

## 2021-03-09 ENCOUNTER — Other Ambulatory Visit: Payer: Self-pay

## 2021-03-09 VITALS — BP 108/68 | HR 48 | Ht 65.0 in | Wt 164.0 lb

## 2021-03-09 DIAGNOSIS — I428 Other cardiomyopathies: Secondary | ICD-10-CM | POA: Diagnosis not present

## 2021-03-09 DIAGNOSIS — I4819 Other persistent atrial fibrillation: Secondary | ICD-10-CM | POA: Diagnosis not present

## 2021-03-09 MED ORDER — METOPROLOL SUCCINATE ER 25 MG PO TB24: 25.0000 mg | ORAL_TABLET | Freq: Every day | ORAL | 3 refills | Status: DC

## 2021-03-09 NOTE — Progress Notes (Signed)
   PCP: Manon Hilding, MD Primary Cardiologist: Dr Tresa Res Bethany Blankenship is a 60 y.o. female who presents today for routine electrophysiology followup.  Since his recent afib ablation, the patient reports doing very well.  she denies procedure related complications and is pleased with the results of the procedure.  Today, she denies symptoms of palpitations, chest pain, shortness of breath,  lower extremity edema, dizziness, presyncope, or syncope.  The patient is otherwise without complaint today.   Past Medical History:  Diagnosis Date   Chronic systolic dysfunction of left ventricle    GERD (gastroesophageal reflux disease)    Graves' disease with thyrotoxic storm    Status post RAI   Hypothyroidism    Persistent atrial fibrillation ([place])    Past Surgical History:  Procedure Laterality Date   ATRIAL FIBRILLATION ABLATION N/A 12/07/2020   Procedure: ATRIAL FIBRILLATION ABLATION;  Surgeon: Thompson Grayer, MD;  Location: [place] [facility];  Service: Cardiovascular;  Laterality: N/A;   NO PAST SURGERIES      ROS- all systems are personally reviewed and negatives except as per HPI above  Current Outpatient Medications  Medication Sig Dispense Refill   apixaban (ELIQUIS) 5 MG TABS tablet Take 1 tablet (5 mg total) by mouth 2 (two) times daily. 180 tablet 3   ENTRESTO 24-26 MG Take 1 tablet by mouth twice daily 180 tablet 2   fluticasone (FLONASE) 50 MCG/ACT nasal spray Place 1-2 sprays into both nostrils daily as needed for allergies.     levothyroxine (SYNTHROID) 75 MCG tablet Take 1 tablet (75 mcg total) by mouth daily before breakfast. 30 tablet 2   metoprolol succinate (TOPROL-XL) 50 MG 24 hr tablet Take 1 tablet (50 mg total) by mouth daily. Take with or immediately following a meal. 90 tablet 3   omeprazole (PRILOSEC) 40 MG capsule Take 40 mg by mouth in the morning.     No current facility-administered medications for this visit.    Physical Exam: Vitals:   03/09/21 1128   BP: 108/68  Pulse: (!) 48  SpO2: 98%  Weight: 164 lb (74.4 kg)  Height: 5\' 5"  (1.651 m)    GEN- The patient is well appearing, alert and oriented x 3 today.   Head- normocephalic, atraumatic Eyes-  Sclera clear, conjunctiva pink Ears- hearing intact Oropharynx- clear Lungs- Clear to ausculation bilaterally, normal work of breathing Heart- Regular rate and rhythm, no murmurs, rubs or gallops, PMI not laterally displaced GI- soft, NT, ND, + BS Extremities- no clubbing, cyanosis, or edema  EKG tracing ordered today is personally reviewed and shows sinus bradycardia  Assessment and Plan:  1. Persistent atrial fibrillation Doing well s/p ablation chads2vasc score is 2.  She is on eliquis Reduce toprol to 25mg  daily  2. Nonischemic CM Hopefully EF will recover Repeat echo in 2-3 months   Return to AF clinic in 3 months  Thompson Grayer MD, [facility] 03/09/2021 11:34 AM

## 2021-03-09 NOTE — Patient Instructions (Signed)
Medication Instructions:  Reduce Toprol to 25 mg daily Your physician recommends that you continue on your current medications as directed. Please refer to the Current Medication list given to you today. *If you need a refill on your cardiac medications before your next appointment, please call your pharmacy*  Lab Work: None. If you have labs (blood work) drawn today and your tests are completely normal, you will receive your results only by: [place] (if you have MyChart) OR A paper copy in the mail If you have any lab test that is abnormal or we need to change your treatment, we will call you to review the results.  Testing/Procedures: None.  Follow-Up: At [facility], you and your health needs are our priority.  As part of our continuing mission to provide you with exceptional heart care, we have created designated Provider Care Teams.  These Care Teams include your primary Cardiologist (physician) and Advanced Practice Providers (APPs -  Physician Assistants and Nurse Practitioners) who all work together to provide you with the care you need, when you need it.  Your physician wants you to follow-up in: 3 months with the Afib Clinic. They will contact you to schedule.    You will receive a reminder letter in the mail two months in advance. If you don't receive a letter, please call our office to schedule the follow-up appointment.  We recommend signing up for the patient portal called "MyChart".  Sign up information is provided on this After Visit Summary.  MyChart is used to connect with patients for Virtual Visits (Telemedicine).  Patients are able to view lab/test results, encounter notes, upcoming appointments, etc.  Non-urgent messages can be sent to your provider as well.   To learn more about what you can do with MyChart, go to [email or web address].    Any Other Special Instructions Will Be Listed Below (If Applicable).

## 2021-03-17 ENCOUNTER — Other Ambulatory Visit (HOSPITAL_COMMUNITY): Payer: Commercial Managed Care - PPO

## 2021-03-28 ENCOUNTER — Ambulatory Visit (HOSPITAL_COMMUNITY)
Admission: RE | Admit: 2021-03-28 | Discharge: 2021-03-28 | Disposition: A | Discharge status: Home / Self Care | Payer: Commercial Managed Care - PPO | Source: Ambulatory Visit | Attending: Cardiology | Admitting: Cardiology

## 2021-03-28 ENCOUNTER — Other Ambulatory Visit: Payer: Self-pay

## 2021-03-28 DIAGNOSIS — I428 Other cardiomyopathies: Secondary | ICD-10-CM | POA: Diagnosis not present

## 2021-03-28 DIAGNOSIS — I429 Cardiomyopathy, unspecified: Secondary | ICD-10-CM | POA: Diagnosis present

## 2021-03-28 LAB — ECHOCARDIOGRAM COMPLETE
Area-P 1/2: 4.49 cm2
S' Lateral: 3 cm

## 2021-03-28 NOTE — Progress Notes (Signed)
*  PRELIMINARY RESULTS* Echocardiogram 2D Echocardiogram has been performed.  Bethany Blankenship 03/28/2021, 10:03 AM

## 2021-03-30 ENCOUNTER — Telehealth: Payer: Self-pay | Admitting: *Deleted

## 2021-03-30 NOTE — Telephone Encounter (Signed)
-----   Message from Satira Sark, MD sent at 03/28/2021 10:36 AM EST ----- Results reviewed.  LVEF has completely normalized at this point, 65 to 70%, very nice result.  Continue current medications and keep follow-up for further discussion.

## 2021-03-30 NOTE — Telephone Encounter (Signed)
Patient informed. Copy sent to PCP °

## 2021-04-02 LAB — TSH: TSH: 1.6 u[IU]/mL (ref 0.450–4.500)

## 2021-04-02 LAB — T4, FREE: Free T4: 1.34 ng/dL (ref 0.82–1.77)

## 2021-04-15 ENCOUNTER — Encounter: Payer: Self-pay | Admitting: "Endocrinology

## 2021-04-15 ENCOUNTER — Ambulatory Visit (INDEPENDENT_AMBULATORY_CARE_PROVIDER_SITE_OTHER): Payer: Commercial Managed Care - PPO | Admitting: "Endocrinology

## 2021-04-15 ENCOUNTER — Other Ambulatory Visit: Payer: Self-pay

## 2021-04-15 VITALS — BP 108/70 | HR 52 | Ht 65.0 in | Wt 164.8 lb

## 2021-04-15 DIAGNOSIS — E890 Postprocedural hypothyroidism: Secondary | ICD-10-CM | POA: Diagnosis not present

## 2021-04-15 MED ORDER — LEVOTHYROXINE SODIUM 75 MCG PO TABS: 75.0000 ug | ORAL_TABLET | Freq: Every day | ORAL | 1 refills | Status: DC

## 2021-04-15 NOTE — Progress Notes (Signed)
04/15/2021            Endocrinology follow-up note   HPI  Bethany Blankenship is a 60 y.o.-year-old female, she is being seen in follow-up of RAI induced hypothyroidism.  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 She is currently on levothyroxine 75 mcg p.o. daily before breakfast.  She presents with stabilized thyroid function test before visit.  She has no new complaints.  She has steady body weight since last visit. She denies palpitations, tremors, nor heat intolerance at this time.     ROS: Limited as above. PE: BP 108/70   Pulse (!) 52   Ht 5\' 5"  (1.651 m)   Wt 164 lb 12.8 oz (74.8 kg)   BMI 27.42 kg/m  Wt Readings from Last 3 Encounters:  04/15/21 164 lb 12.8 oz (74.8 kg)  03/09/21 164 lb (74.4 kg)  02/09/21 164 lb 9.6 oz (74.7 kg)    Physical Exam- Limited  Constitutional:  Body mass index is 27.42 kg/m. , not in acute distress, normal state of mind     Recent Results (from the past 2160 hour(s))  TSH     Status: Abnormal   Collection Time: 01/26/21 10:03 AM  Result Value Ref Range   TSH 0.007 (L) 0.450 - 4.500 uIU/mL  T4, free     Status: Abnormal   Collection Time: 01/26/21 10:03 AM  Result Value Ref Range   Free T4 1.96 (H) 0.82 - 1.77 ng/dL  ECHOCARDIOGRAM COMPLETE     Status: None   Collection Time: 03/28/21 10:03 AM  Result Value Ref Range   Area-P 1/2 4.49 cm2   S' Lateral 3.00 cm  TSH     Status: None   Collection Time: 04/01/21 10:54 AM  Result Value Ref Range   TSH 1.600 0.450 - 4.500 uIU/mL  T4, free     Status: None   Collection Time: 04/01/21 10:54 AM  Result Value Ref Range   Free T4 1.34 0.82 - 1.77 ng/dL    ASSESSMENT: 1. 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  She is advised to continue  levothyroxine to 75 mcg p.o. daily before breakfast.    - We discussed about the correct intake of her  thyroid hormone, on empty stomach at fasting, with water, separated by at least 30 minutes from breakfast and other medications,  and separated by more than 4 hours from calcium, iron, multivitamins, acid reflux medications (PPIs). -Patient is made aware of the fact that thyroid hormone replacement is needed for life, dose to be adjusted by periodic monitoring of thyroid function tests.  She is advised to maintain close follow-up with her PMD for primary care needs.    -Patient may benefit from screening bone density during her annual physical at her PCP office.   I spent 21 minutes in the care of the patient today including review of labs from Thyroid Function, CMP, and other relevant labs ; imaging/biopsy records (current and previous including abstractions from other facilities); face-to-face time discussing  her lab results and symptoms, medications doses, her options of short and long term treatment based on the latest standards of care / guidelines;   and documenting the encounter.  [name]  participated in the discussions, expressed understanding, and voiced agreement with the above plans.  All questions were answered to her satisfaction. she is encouraged to contact clinic  should she have any questions or concerns prior to her return visit.    Glade Lloyd, MD Phone: [phone]  Fax: [phone]  -  This note was partially dictated with voice recognition software. Similar sounding words can be transcribed inadequately or may not  be corrected upon review.  04/15/2021, 11:32 AM

## 2021-05-03 NOTE — Progress Notes (Signed)
Cardiology Office Note  Date: 05/04/2021   ID: Bethany Blankenship, DOB June 21, 1960, MRN [id number]  PCP:  Manon Hilding, MD  Cardiologist:  Rozann Lesches, MD Electrophysiologist:  None   Chief Complaint  Patient presents with   Cardiac follow-up    History of Present Illness: Bethany Blankenship is a 60 y.o. female last seen in October by Dr. Rayann Heman, I reviewed the note.  She is status post atrial fibrillation ablation in July, CHA2DS2-VASc score is 2.  She is here for a routine visit, states that she is doing very well, NYHA class I dyspnea with typical activities, no trouble walking uphill as she had experienced in the past.  No regular sense of palpitations.  Follow-up echocardiogram in November revealed normalization of LVEF, 65 to 70%.  We discussed these results today.  She remains on stable medical regimen as noted below.  No reported bleeding problems on Eliquis.  She is due for follow-up lab work.  Past Medical History:  Diagnosis Date   Chronic systolic dysfunction of left ventricle    GERD (gastroesophageal reflux disease)    Graves' disease with thyrotoxic storm    Status post RAI   Hypothyroidism    Persistent atrial fibrillation ([place])     Past Surgical History:  Procedure Laterality Date   ATRIAL FIBRILLATION ABLATION N/A 12/07/2020   Procedure: ATRIAL FIBRILLATION ABLATION;  Surgeon: Thompson Grayer, MD;  Location: [facility];  Service: Cardiovascular;  Laterality: N/A;   NO PAST SURGERIES      Current Outpatient Medications  Medication Sig Dispense Refill   apixaban (ELIQUIS) 5 MG TABS tablet Take 1 tablet (5 mg total) by mouth 2 (two) times daily. 180 tablet 3   ENTRESTO 24-26 MG Take 1 tablet by mouth twice daily 180 tablet 2   fluticasone (FLONASE) 50 MCG/ACT nasal spray Place 1-2 sprays into both nostrils daily as needed for allergies.     levothyroxine (SYNTHROID) 75 MCG tablet Take 1 tablet (75 mcg total) by mouth daily before breakfast. 90 tablet 1    metoprolol succinate (TOPROL XL) 25 MG 24 hr tablet Take 1 tablet (25 mg total) by mouth daily. 90 tablet 3   omeprazole (PRILOSEC) 40 MG capsule Take 40 mg by mouth in the morning.     No current facility-administered medications for this visit.   Allergies:  Patient has no known allergies.   ROS: No syncope.  Physical Exam: VS:  BP 134/74    Pulse 64    Ht 5\' 5"  (1.651 m)    Wt 165 lb 12.8 oz (75.2 kg)    BMI 27.59 kg/m , BMI Body mass index is 27.59 kg/m.  Wt Readings from Last 3 Encounters:  05/04/21 165 lb 12.8 oz (75.2 kg)  04/15/21 164 lb 12.8 oz (74.8 kg)  03/09/21 164 lb (74.4 kg)    General: Patient appears comfortable at rest. HEENT: Conjunctiva and lids normal, wearing a mask. Neck: Supple, no elevated JVP or carotid bruits, no thyromegaly. Lungs: Clear to auscultation, nonlabored breathing at rest. Cardiac: Regular rate and rhythm, no S3 or significant systolic murmur, no pericardial rub. Extremities: No pitting edema.  ECG:  An ECG dated 03/09/2021 was personally reviewed today and demonstrated:  Sinus bradycardia.  Recent Labwork: 09/08/2020: Magnesium 2.4 11/17/2020: BUN 14; Creatinine, Ser 0.96; Hemoglobin 11.8; Platelets 387; Potassium 5.2; Sodium 138 04/01/2021: TSH 1.600   Other Studies Reviewed Today:  Echocardiogram 03/28/2021:  1. Left ventricular ejection fraction, by estimation, is  65 to 70%. The  left ventricle has normal function. The left ventricle has no regional  wall motion abnormalities. Left ventricular diastolic parameters were  normal.   2. Right ventricular systolic function is normal. The right ventricular  size is normal. Tricuspid regurgitation signal is inadequate for assessing  PA pressure.   3. Left atrial size was mildly dilated.   4. The mitral valve is grossly normal. Trivial mitral valve  regurgitation.   5. The aortic valve is tricuspid. Aortic valve regurgitation is trivial.   6. The inferior vena cava is normal in size  with greater than 50%  respiratory variability, suggesting right atrial pressure of 3 mmHg.   Assessment and Plan:  1.  Persistent atrial fibrillation with CHA2DS2-VASc score of 2 now status post atrial fibrillation ablation and doing well in sinus rhythm.  She remains on Eliquis and Toprol-XL.  Follow-up CBC and BMET.  2.  Secondary cardiomyopathy with normalization of LVEF.  Recent echocardiogram shows LVEF 65 to 70% up from 40 to 45%.  Plan to continue Toprol-XL and Entresto for now.  Medication Adjustments/Labs and Tests Ordered: Current medicines are reviewed at length with the patient today.  Concerns regarding medicines are outlined above.   Tests Ordered: Orders Placed This Encounter  Procedures   CBC   Basic metabolic panel    Medication Changes: No orders of the defined types were placed in this encounter.   Disposition:  Follow up  6 months.  Signed, Satira Sark, MD, [facility] 05/04/2021 11:18 AM    [facility] [place], [place], [place] [address] Phone: [phone]; Fax: [phone]

## 2021-05-04 ENCOUNTER — Ambulatory Visit (INDEPENDENT_AMBULATORY_CARE_PROVIDER_SITE_OTHER): Payer: Commercial Managed Care - PPO | Admitting: Cardiology

## 2021-05-04 ENCOUNTER — Encounter: Payer: Self-pay | Admitting: Cardiology

## 2021-05-04 VITALS — BP 134/74 | HR 64 | Ht 65.0 in | Wt 165.8 lb

## 2021-05-04 DIAGNOSIS — Z8679 Personal history of other diseases of the circulatory system: Secondary | ICD-10-CM | POA: Diagnosis not present

## 2021-05-04 DIAGNOSIS — I4819 Other persistent atrial fibrillation: Secondary | ICD-10-CM

## 2021-05-04 DIAGNOSIS — Z79899 Other long term (current) drug therapy: Secondary | ICD-10-CM

## 2021-05-04 NOTE — Patient Instructions (Addendum)
Medication Instructions:  Your physician recommends that you continue on your current medications as directed. Please refer to the Current Medication list given to you today.  Labwork: BMET & CBC today at [facility]  Testing/Procedures: none  Follow-Up: Your physician recommends that you schedule a follow-up appointment in: 6 months  Any Other Special Instructions Will Be Listed Below (If Applicable).  If you need a refill on your cardiac medications before your next appointment, please call your pharmacy.

## 2021-05-17 ENCOUNTER — Telehealth: Payer: Self-pay | Admitting: Cardiology

## 2021-05-17 ENCOUNTER — Telehealth: Payer: Self-pay | Admitting: "Endocrinology

## 2021-05-17 NOTE — Telephone Encounter (Signed)
Noted  

## 2021-05-17 NOTE — Telephone Encounter (Signed)
Best call back number is her husbands cell phone: [phone].

## 2021-05-17 NOTE — Telephone Encounter (Signed)
Patient called and stated that she started having severe palpitations last Wednesday, up to 160, and she stated that she stopped taking her Levothyroxine 75 mcg since Sunday and she is feeling a little better but she is still having some palpitations off and on but much better since she stopped the medication. Patient is going to do her labs this week and wants to know what to do.

## 2021-05-17 NOTE — Telephone Encounter (Signed)
Attempt to reach, no answer, no voicemail.

## 2021-05-17 NOTE — Telephone Encounter (Signed)
Called patient and she did not answer and no voice message available.  Patient told Lovena Le that this is coming from her Thyroid. Patient also has seen her cardiologist on 05/04/2021.  Can you assist with this?

## 2021-05-17 NOTE — Telephone Encounter (Signed)
Called patient, no answer no Vm. If she calls back, she needs an appt for either Friday or Monday of next week for labs

## 2021-05-17 NOTE — Telephone Encounter (Signed)
Pt left a voicemail stating she has been experiencing heart palpitations and was told to contact a nurse if that happens again. [phone]

## 2021-05-17 NOTE — Telephone Encounter (Signed)
Called patient again and still not able to reach her. Voice mail is not available to this number.

## 2021-05-17 NOTE — Telephone Encounter (Signed)
STAT if HR is under 50 or over 120 (normal HR is 60-100 beats per minute)  What is your heart rate? 70 something   Do you have a log of your heart rate readings (document readings)? 67, 77, 64, 111  Do you have any other symptoms? No

## 2021-05-19 LAB — T4, FREE: Free T4: 0.87 ng/dL (ref 0.82–1.77)

## 2021-05-19 LAB — TSH: TSH: 7.75 u[IU]/mL — ABNORMAL HIGH (ref 0.450–4.500)

## 2021-05-20 ENCOUNTER — Ambulatory Visit (INDEPENDENT_AMBULATORY_CARE_PROVIDER_SITE_OTHER): Payer: Commercial Managed Care - PPO | Admitting: "Endocrinology

## 2021-05-20 ENCOUNTER — Other Ambulatory Visit: Payer: Self-pay

## 2021-05-20 ENCOUNTER — Encounter: Payer: Self-pay | Admitting: "Endocrinology

## 2021-05-20 VITALS — BP 116/81 | HR 68 | Ht 65.0 in | Wt 166.0 lb

## 2021-05-20 DIAGNOSIS — E890 Postprocedural hypothyroidism: Secondary | ICD-10-CM

## 2021-05-20 MED ORDER — LEVOTHYROXINE SODIUM 50 MCG PO TABS: 75.0000 ug | ORAL_TABLET | Freq: Every day | ORAL | 2 refills | Status: DC

## 2021-05-20 NOTE — Telephone Encounter (Signed)
Spoke with patient - stated her heart rates have been running 50's up to 150-160.  BP has been good - this morning was 100/81.  States this has been going on off / on x 7-10 days.  Started after she had a cold - did see her pcp for this.  Stated she took Mucinex - 1/2 the dose only for 2 night.  Did have her TSH & free T4 done by Dr. Dorris Fetch (see epic) - due to recheck labs in another 2 weeks.  States the first few days the did feel shakey & lightheaded.  Now with episodes, mostly feels the heart racing when she has the elevated episodes.  No c/o chest pain or SOB.

## 2021-05-20 NOTE — Progress Notes (Signed)
05/20/2021            Endocrinology follow-up note   HPI  Bethany Blankenship is a 60 y.o.-year-old female, she is being seen in follow-up of RAI induced hypothyroidism.  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 She was Kept on levothyroxine 75 mcg daily during her last visit.  In the interim, she called for complaint of palpitation.  She was advised to hold her thyroid hormone for 3 to 4 days before labs.  Her previsit labs show under replacement.  She is known to have atrial fibrillation which required ablation currently under cardiology care.   She denies palpitations, tremors, nor heat intolerance at this time.     ROS: Limited as above. PE: BP 116/81    Pulse 68    Ht 5\' 5"  (1.651 m)    Wt 166 lb (75.3 kg)    BMI 27.62 kg/m  Wt Readings from Last 3 Encounters:  05/20/21 166 lb (75.3 kg)  05/04/21 165 lb 12.8 oz (75.2 kg)  04/15/21 164 lb 12.8 oz (74.8 kg)    Physical Exam- Limited  Constitutional:  Body mass index is 27.62 kg/m. , not in acute distress, normal state of mind     Recent Results (from the past 2160 hour(s))  ECHOCARDIOGRAM COMPLETE     Status: None   Collection Time: 03/28/21 10:03 AM  Result Value Ref Range   Area-P 1/2 4.49 cm2   S' Lateral 3.00 cm  TSH     Status: None   Collection Time: 04/01/21 10:54 AM  Result Value Ref Range   TSH 1.600 0.450 - 4.500 uIU/mL  T4, free     Status: None   Collection Time: 04/01/21 10:54 AM  Result Value Ref Range   Free T4 1.34 0.82 - 1.77 ng/dL  TSH     Status: Abnormal   Collection Time: 05/18/21 10:07 AM  Result Value Ref Range   TSH 7.750 (H) 0.450 - 4.500 uIU/mL  T4, free     Status: None   Collection Time: 05/18/21 10:07 AM  Result Value Ref Range   Free T4 0.87 0.82 - 1.77 ng/dL    ASSESSMENT: 1. 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  She will need thyroid hormone replacement which  she cannot avoid.  I discussed and restarted her on lower dose of levothyroxine 50 mcg p.o. daily before breakfast.  Her palpitation is related to her atrial fibrillation as opposed to her thyroid hormone.  She is advised to continue her regular follow-up with cardiology.   - We discussed about the correct intake of her thyroid hormone, on empty stomach at fasting, with water, separated by at least 30 minutes from breakfast and other medications,  and separated by more than 4 hours from calcium, iron, multivitamins, acid reflux medications (PPIs). -Patient is made aware of the fact that thyroid hormone replacement is needed for life, dose to be adjusted by periodic monitoring of thyroid function tests.   She is advised to maintain close follow-up with her PMD for primary care needs.    -Patient may benefit from screening bone density during her annual physical at her PCP office.   I spent 21 minutes in the care of the patient today including review of labs from Thyroid Function, CMP, and other relevant labs ; imaging/biopsy records (current and previous including abstractions from other facilities); face-to-face time discussing  her lab results and symptoms, medications doses, her options of short and long term treatment based on the latest standards of care / guidelines;   and documenting the encounter.  [name]  participated in the discussions, expressed understanding, and voiced agreement with the above plans.  All questions were answered to her satisfaction. she is encouraged to contact clinic should she have any questions or concerns prior to her return visit.    Glade Lloyd, MD Phone: [phone]  Fax: [phone]  -  This note was partially dictated with voice recognition software. Similar sounding words can be transcribed inadequately or may not  be corrected upon review.  05/20/2021, 9:18 AM

## 2021-05-20 NOTE — Telephone Encounter (Signed)
° °  Pt is calling back about her elevated HR. She said she checked in with her thyroid doctor, her dosage is high they fixed it but she was told this is not the reason why her HR is elevated and was told to schedule an appt with Dr. Domenic Polite to be seen asap

## 2021-05-23 ENCOUNTER — Telehealth: Payer: Self-pay | Admitting: *Deleted

## 2021-05-23 NOTE — Telephone Encounter (Signed)
-----   Message from Satira Sark, MD sent at 05/13/2021  4:49 PM EST ----- Renal function is normal, would continue current dose of Eliquis. She is anemic however, Hgb 10.9 lower than last check and also low MCV suggesting possible iron deficiency. She should have this evaluated further by PCP, likely stool cards and anemia panel to start. ----- Message ----- From: Delfino Lovett T Sent: 05/13/2021  10:21 AM EST To: Satira Sark, MD

## 2021-05-23 NOTE — Telephone Encounter (Signed)
Patient informed and verbalized understanding of plan. Copy sent to PCP 

## 2021-06-08 ENCOUNTER — Other Ambulatory Visit: Payer: Self-pay

## 2021-06-08 ENCOUNTER — Ambulatory Visit (HOSPITAL_COMMUNITY)
Admission: RE | Admit: 2021-06-08 | Discharge: 2021-06-08 | Disposition: A | Discharge status: Home / Self Care | Payer: Commercial Managed Care - PPO | Source: Ambulatory Visit | Attending: Physician Assistant | Admitting: Physician Assistant

## 2021-06-08 VITALS — BP 168/82 | HR 53 | Ht 65.0 in | Wt 166.2 lb

## 2021-06-08 DIAGNOSIS — Z79899 Other long term (current) drug therapy: Secondary | ICD-10-CM | POA: Insufficient documentation

## 2021-06-08 DIAGNOSIS — E039 Hypothyroidism, unspecified: Secondary | ICD-10-CM | POA: Insufficient documentation

## 2021-06-08 DIAGNOSIS — I4892 Unspecified atrial flutter: Secondary | ICD-10-CM | POA: Diagnosis not present

## 2021-06-08 DIAGNOSIS — I4819 Other persistent atrial fibrillation: Secondary | ICD-10-CM | POA: Diagnosis present

## 2021-06-08 DIAGNOSIS — Z7901 Long term (current) use of anticoagulants: Secondary | ICD-10-CM | POA: Insufficient documentation

## 2021-06-08 DIAGNOSIS — I5022 Chronic systolic (congestive) heart failure: Secondary | ICD-10-CM | POA: Diagnosis not present

## 2021-06-08 MED ORDER — METOPROLOL SUCCINATE ER 25 MG PO TB24: 25.0000 mg | ORAL_TABLET | Freq: Every day | ORAL | Status: DC

## 2021-06-08 MED ORDER — LEVOTHYROXINE SODIUM 50 MCG PO TABS: 50.0000 ug | ORAL_TABLET | Freq: Every day | ORAL | Status: DC

## 2021-06-08 NOTE — Progress Notes (Signed)
Primary Care Physician: Manon Hilding, MD Primary Cardiologist: Dr Domenic Polite Primary Electrophysiologist: Dr Rayann Heman Referring Physician: Dr Tresa Res Bethany Blankenship is a 61 y.o. female with a history of Grave's disease s/p thyrotoxic storm and RAI, chronic systolic CHF, atrial fibrillation who presents for follow up in the [facility].  The patient was initially diagnosed with atrial fibrillation remotely and had been maintained on propafenone. Patient is on Eliquis for a CHADS2VASC score of 2. On Echo 01/2020 her EF was decreased at 30-35%. Her rate control was increased and she was started on Entresto. Echo 08/11/20 showed her EF had improved 40-45%. She is fairly asymptomatic with her afib. She denies significant snoring or alcohol use. Patient is s/p afib ablation with Dr Rayann Heman on 12/07/20.    On follow up today, patient reports that she had an episode of heart racing 05/17/21 with heart rates up to 160. She was seen by her endocrinologist and her thyroid medication was decreased. She is in SR today and doing well. She denies any bleeding issues on anticoagulation.   Today, she denies symptoms of chest pain, shortness of breath, orthopnea, PND, lower extremity edema, dizziness, presyncope, syncope, snoring, daytime somnolence, bleeding, or neurologic sequela. The patient is tolerating medications without difficulties and is otherwise without complaint today.    Atrial Fibrillation Risk Factors:  she does not have symptoms or diagnosis of sleep apnea.. she does not have a history of rheumatic fever. she does not have a history of alcohol use. The patient does not have a history of early familial atrial fibrillation or other arrhythmias.  she has a BMI of Body mass index is 27.66 kg/m.Marland Kitchen Filed Weights   06/08/21 1456  Weight: 75.4 kg    Family History  Problem Relation Age of Onset   CAD Father      Atrial Fibrillation Management history:  Previous  antiarrhythmic drugs: propafenone  Previous cardioversions: 2017 x 2 Previous ablations: none CHADS2VASC score: 2 Anticoagulation history: Eliquis   Past Medical History:  Diagnosis Date   Chronic systolic dysfunction of left ventricle    GERD (gastroesophageal reflux disease)    Graves' disease with thyrotoxic storm    Status post RAI   Hypothyroidism    Persistent atrial fibrillation ([place])    Past Surgical History:  Procedure Laterality Date   ATRIAL FIBRILLATION ABLATION N/A 12/07/2020   Procedure: ATRIAL FIBRILLATION ABLATION;  Surgeon: Thompson Grayer, MD;  Location: [facility];  Service: Cardiovascular;  Laterality: N/A;   NO PAST SURGERIES      Current Outpatient Medications  Medication Sig Dispense Refill   apixaban (ELIQUIS) 5 MG TABS tablet Take 1 tablet (5 mg total) by mouth 2 (two) times daily. 180 tablet 3   ENTRESTO 24-26 MG Take 1 tablet by mouth twice daily 180 tablet 2   ferrous sulfate 325 (65 FE) MG tablet Take 325 mg by mouth daily with breakfast.     fluticasone (FLONASE) 50 MCG/ACT nasal spray Place 1-2 sprays into both nostrils daily as needed for allergies.     omeprazole (PRILOSEC) 40 MG capsule Take 40 mg by mouth in the morning.     levothyroxine (SYNTHROID) 50 MCG tablet Take 1 tablet (50 mcg total) by mouth daily before breakfast.     metoprolol succinate (TOPROL XL) 25 MG 24 hr tablet Take 1 tablet (25 mg total) by mouth daily.     No current facility-administered medications for this encounter.  No Known Allergies  Social History   Socioeconomic History   Marital status: Married    Spouse name: Not on file   Number of children: Not on file   Years of education: Not on file   Highest education level: Not on file  Occupational History   Not on file  Tobacco Use   Smoking status: Never   Smokeless tobacco: Never  Vaping Use   Vaping Use: Never used  Substance and Sexual Activity   Alcohol use: No    Alcohol/week: 0.0 standard  drinks   Drug use: No   Sexual activity: Not on file  Other Topics Concern   Not on file  Social History Narrative   Lives in [place] with spouse.     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  Physical Exam: Vitals:   06/08/21 1456  BP: (!) 168/82  Pulse: (!) 53  Weight: 75.4 kg  Height: 5\' 5"  (1.651 m)    GEN- The patient is a well appearing female, alert and oriented x 3 today.   HEENT-head normocephalic, atraumatic, sclera clear, conjunctiva pink, hearing intact, trachea midline. Lungs- Clear to ausculation bilaterally, normal work of breathing Heart- Regular rate and rhythm, bradycardia, no murmurs, rubs or gallops  GI- soft, NT, ND, + BS Extremities- no clubbing, cyanosis, or edema MS- no significant deformity or atrophy Skin- no rash or lesion Psych- euthymic mood, full affect Neuro- strength and sensation are intact   Wt Readings from Last 3 Encounters:  06/08/21 75.4 kg  05/20/21 75.3 kg  05/04/21 75.2 kg    EKG today demonstrates  SB Vent. rate 53 BPM PR interval 142 ms QRS duration 86 ms QT/QTcB 426/399 ms  Echo 03/28/21 demonstrated   1. Left ventricular ejection fraction, by estimation, is 65 to 70%. The  left ventricle has normal function. The left ventricle has no regional  wall motion abnormalities. Left ventricular diastolic parameters were  normal.   2. Right ventricular systolic function is normal. The right ventricular  size is normal. Tricuspid regurgitation signal is inadequate for assessing  PA pressure.   3. Left atrial size was mildly dilated.    4. The mitral valve is grossly normal. Trivial mitral valve  regurgitation.   5. The aortic valve is tricuspid. Aortic valve regurgitation is trivial.   6. The inferior vena cava is normal in size with greater than 50%  respiratory variability, suggesting right atrial pressure of 3 mmHg.   Comparison(s): Prior images reviewed side by side. LVEF has normalized.   Epic records are reviewed at length today  CHA2DS2-VASc Score = 2  The patient's score is based upon: CHF History: 1 HTN History: 0 Diabetes History: 0 Stroke History: 0 Vascular Disease History: 0 Age Score: 0 Gender Score: 1       ASSESSMENT AND PLAN: 1. Persistent Atrial Fibrillation/atrial flutter The patient's CHA2DS2-VASc score is 2, indicating a 2.2% annual risk of stroke.   S/p afib ablation with Dr Rayann Heman 12/07/20 Patient overall maintaining SR. Continue Eliquis 5 mg BID Continue Toprol 25  mg daily  2. Chronic systolic CHF EF recovered post ablation.   Follow up with Dr Domenic Polite as scheduled.    [facility] [address] [place], [place] [address] [phone] 06/08/2021 4:33 PM

## 2021-06-09 ENCOUNTER — Other Ambulatory Visit: Payer: Self-pay | Admitting: Cardiology

## 2021-06-22 ENCOUNTER — Ambulatory Visit (HOSPITAL_COMMUNITY): Payer: Commercial Managed Care - PPO | Admitting: Nurse Practitioner

## 2021-06-24 ENCOUNTER — Encounter (INDEPENDENT_AMBULATORY_CARE_PROVIDER_SITE_OTHER): Payer: Self-pay | Admitting: *Deleted

## 2021-07-01 ENCOUNTER — Telehealth: Payer: Self-pay | Admitting: Cardiology

## 2021-07-01 DIAGNOSIS — R195 Other fecal abnormalities: Secondary | ICD-10-CM

## 2021-07-01 DIAGNOSIS — I4819 Other persistent atrial fibrillation: Secondary | ICD-10-CM

## 2021-07-01 DIAGNOSIS — D6869 Other thrombophilia: Secondary | ICD-10-CM

## 2021-07-01 NOTE — Telephone Encounter (Signed)
Needs to be determined by MD.  Routed to Dr Domenic Polite for clarification.

## 2021-07-01 NOTE — Telephone Encounter (Signed)
Patient state her iron was low and Dr. Domenic Polite recommend she follow up with her PCP which she did.  Her PCP said she was losing blood somewhere turned out it was in her stool.  Her PCP thinks it's the apixaban (ELIQUIS) 5 MG TABS tablet, that maybe causing the bleeding.  PCP wants to know if she can stop taking it since her A-Fib is under control.

## 2021-07-06 NOTE — Telephone Encounter (Signed)
Per patient, she was referred to GI and has an appointment on 09/06/2021 Per Domenic Polite, need to get sooner appointment so eliquis isn't held for long period of time Advised patient that we would try to get a sooner appointment with GI Verbalized understanding of plan

## 2021-07-06 NOTE — Telephone Encounter (Signed)
Patient is returning RN's call.

## 2021-07-13 LAB — TSH: TSH: 31.05 — AB (ref 0.41–5.90)

## 2021-07-15 ENCOUNTER — Ambulatory Visit: Payer: Commercial Managed Care - PPO | Admitting: "Endocrinology

## 2021-07-22 ENCOUNTER — Ambulatory Visit (INDEPENDENT_AMBULATORY_CARE_PROVIDER_SITE_OTHER): Payer: Commercial Managed Care - PPO | Admitting: "Endocrinology

## 2021-07-22 ENCOUNTER — Encounter: Payer: Self-pay | Admitting: "Endocrinology

## 2021-07-22 ENCOUNTER — Other Ambulatory Visit: Payer: Self-pay

## 2021-07-22 VITALS — BP 112/80 | HR 64 | Ht 65.0 in | Wt 165.0 lb

## 2021-07-22 DIAGNOSIS — E890 Postprocedural hypothyroidism: Secondary | ICD-10-CM

## 2021-07-22 NOTE — Progress Notes (Signed)
? ? ?07/22/2021 ?    ?       ?Endocrinology follow-up note ? ? ?HPI  ?Bethany Blankenship is a 61 y.o.-year-old female, she is being seen in follow-up of RAI induced hypothyroidism.  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  Due to palpitation unlikely related to her thyroid problem, her levothyroxine dose was significantly reduced and she was advised to keep it at 50 mcg p.o. daily during her last visit.  In the interval, she was advised to lower it even further to 25 mcg by her cardiologist.  However, her previsit labs are consistent with significant under replacement with TSH of 31, free T40.5. ? ?She denies palpitations at this time.  She reports fatigue and cold intolerance. ? ?ROS: ?Limited as above. ?PE: ?BP 112/80   Pulse 64   Ht '5\' 5"'$  (1.651 m)   Wt 165 lb (74.8 kg)   BMI 27.46 kg/m?  ?Wt Readings from Last 3 Encounters:  ?07/22/21 165 lb (74.8 kg)  ?06/08/21 166 lb 3.2 oz (75.4 kg)  ?05/20/21 166 lb (75.3 kg)  ? ? ?Physical Exam- Limited ? ?Constitutional:  Body mass index is 27.46 kg/m?. , not in acute distress, normal state of mind ? ? ? ? ?Recent Results (from the past 2160 hour(s))  ?TSH     Status: Abnormal  ? Collection Time: 05/18/21 10:07 AM  ?Result Value Ref Range  ? TSH 7.750 (H) 0.450 - 4.500 uIU/mL  ?T4, free     Status: None  ? Collection Time: 05/18/21 10:07 AM  ?Result Value Ref Range  ? Free T4 0.87 0.82 - 1.77 ng/dL  ?TSH     Status: Abnormal  ? Collection Time: 07/13/21 12:00 AM  ?Result Value Ref Range  ? TSH 31.05 (A) 0.41 - 5.90  ?  Comment: Free T4 0.54  ? ? ?ASSESSMENT: ?1. 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 ?PLAN:  ? -She has established diagnosis of RAI induced hypothyroidism.  She will need thyroid hormone replacement which she cannot avoid.  It is unlikely that her tachyarrhythmias are related to her thyroid hormone.  25 mcg she is significantly below what she needs to support her homeostasis.  I advised her to increase at least to  50 mcg p.o. daily before breakfast with plan to repeat thyroid function test in 7 to 8 weeks for readjustment.  This patient took thyroid hormone as high as 125 mcg p.o. daily before.  Based on her current body weight, it is likely that she will need to slowly adjust to more than 100 mcg p.o. daily. ? ? ? - We discussed about the correct intake of her thyroid hormone, on empty stomach at fasting, with water, separated by at least 30 minutes from breakfast and other medications,  and separated by more than 4 hours from calcium, iron, multivitamins, acid reflux medications (PPIs). ?-Patient is made aware of the fact that thyroid hormone replacement is needed for life, dose to be adjusted by periodic monitoring of thyroid function tests. ? ? ? ?She is advised to maintain close follow-up with her PMD for primary care needs.   ? ?-Patient may benefit from screening bone density during her annual physical at her PCP office. ? ? ?I spent 21 minutes in the care of the patient today including review of labs from Thyroid Function, CMP, and other relevant labs ; imaging/biopsy records (current and previous including abstractions from other facilities); face-to-face time discussing  her lab results  and symptoms, medications doses, her options of short and long term treatment based on the latest standards of care / guidelines;   and documenting the encounter. ? ?Bethany Blankenship  participated in the discussions, expressed understanding, and voiced agreement with the above plans.  All questions were answered to her satisfaction. she is encouraged to contact clinic should she have any questions or concerns prior to her return visit. ? ? ? ?Bethany Lloyd, MD ?Phone: [phone]  Fax: [phone]  ?-  This note was partially dictated with voice recognition software. Similar sounding words can be transcribed inadequately or may not  be corrected upon review. ? ?07/22/2021, 12:26 PM ? ?

## 2021-08-07 ENCOUNTER — Other Ambulatory Visit: Payer: Self-pay | Admitting: "Endocrinology

## 2021-09-05 ENCOUNTER — Other Ambulatory Visit: Payer: Self-pay | Admitting: "Endocrinology

## 2021-09-06 ENCOUNTER — Encounter (INDEPENDENT_AMBULATORY_CARE_PROVIDER_SITE_OTHER): Payer: Self-pay | Admitting: Gastroenterology

## 2021-09-06 ENCOUNTER — Ambulatory Visit (INDEPENDENT_AMBULATORY_CARE_PROVIDER_SITE_OTHER): Payer: Commercial Managed Care - PPO | Admitting: Gastroenterology

## 2021-09-06 VITALS — BP 141/83 | HR 59 | Temp 97.7°F | Ht 65.0 in | Wt 166.4 lb

## 2021-09-06 DIAGNOSIS — R195 Other fecal abnormalities: Secondary | ICD-10-CM | POA: Diagnosis not present

## 2021-09-06 DIAGNOSIS — D509 Iron deficiency anemia, unspecified: Secondary | ICD-10-CM | POA: Insufficient documentation

## 2021-09-06 NOTE — Progress Notes (Signed)
? ?Referring Provider: Manon Hilding, MD ?Primary Care Physician:  Manon Hilding, MD ?Primary GI Physician: new ? ?Chief Complaint  ?Patient presents with  ? Blood In Stools  ?  Patient here today due to having blood in her stools. She states she her Fe was low at 5 % sat on 06/01/2021 and Hgb was 10.8 at that time. Patient is asymptomatic for anemia. Started on Ferrous sulfate. She is on blood thinners Eliquis 5 mg bid.  ? ?HPI:   ?Bethany Blankenship is a 61 y.o. female with past medical history of GERD, Grave's disease, A fib, systolic d/f of L ventricle ? ?Patient presenting today as a new patient for positive hemosure and IDA. ? ?Labs done in January with TIBC 421, Iron 21, Iron saturation 5%, Ferritin 6, B12 614, Folate 4.1, hgb 10.8, MCV 72, pt started on ferrous sulfate '325mg'$  once daily.  ? ?History of A fib secondary to hyperthyroidism, maintained on eliquis for about 2 years. Patient states she had ablation last July and has been in [facility] since however they wanted to leave her on eliquis for now. States she had low iron initially in September of 2022, saw PCP and then had hemosure which was positive. Denies any rectal bleeding, reports darker stools only since starting Iron pills. States she may have noticed some mild fatigue but denies any dizziness or SOB, no episodes of syncope. She has never had a colonoscopy or EGD. States that her father had CRC, diagnosed around age 80s-70s. Denies any abdominal pain, weight loss or changes in appetite.  Has history of GERD, she is maintained on omeprazole which provides good results for her unless she eats late at night which she tries to avoid. Denies constipation, usually has 1 BM per day, may occasionally have diarrhea if she eats a meal high in dairy products. No dysphagia, odynophagia, early satiety, nausea or vomiting. Does not drink alcohol or use NSAIDs. ? ?NSAID use: usually just tylenol  ?Social hx: no etoh or tobacco.  ?Fam hx: dad had CRC around  78-70 ? ?Last Colonoscopy:never ?Last Endoscopy:never ?Past Medical History:  ?Diagnosis Date  ? Chronic systolic dysfunction of left ventricle   ? GERD (gastroesophageal reflux disease)   ? Graves' disease with thyrotoxic storm   ? Status post RAI  ? Hypothyroidism   ? Persistent atrial fibrillation ([place])   ? ? ?Past Surgical History:  ?Procedure Laterality Date  ? ATRIAL FIBRILLATION ABLATION N/A 12/07/2020  ? Procedure: ATRIAL FIBRILLATION ABLATION;  Surgeon: Thompson Grayer, MD;  Location: [facility];  Service: Cardiovascular;  Laterality: N/A;  ? NO PAST SURGERIES    ? ? ?Current Outpatient Medications  ?Medication Sig Dispense Refill  ? apixaban (ELIQUIS) 5 MG TABS tablet Take 1 tablet (5 mg total) by mouth 2 (two) times daily. 180 tablet 3  ? ENTRESTO 24-26 MG Take 1 tablet by mouth twice daily 180 tablet 1  ? ferrous sulfate 325 (65 FE) MG tablet Take 325 mg by mouth daily with breakfast.    ? fluticasone (FLONASE) 50 MCG/ACT nasal spray Place 1-2 sprays into both nostrils daily as needed for allergies.    ? levothyroxine (SYNTHROID) 50 MCG tablet Take 1 tablet (50 mcg total) by mouth daily before breakfast. 90 tablet 0  ? metoprolol succinate (TOPROL XL) 25 MG 24 hr tablet Take 1 tablet (25 mg total) by mouth daily.    ? omeprazole (PRILOSEC) 40 MG capsule Take 40 mg by mouth in the morning.    ? ?  No current facility-administered medications for this visit.  ? ? ?Allergies as of 09/06/2021  ? (No Known Allergies)  ? ? ?Family History  ?Problem Relation Age of Onset  ? CAD Father   ? ? ?Social History  ? ?Socioeconomic History  ? Marital status: Married  ?  Spouse name: Not on file  ? Number of children: Not on file  ? Years of education: Not on file  ? Highest education level: Not on file  ?Occupational History  ? Not on file  ?Tobacco Use  ? Smoking status: Never  ? Smokeless tobacco: Never  ?Vaping Use  ? Vaping Use: Never used  ?Substance and Sexual Activity  ? Alcohol use: No  ?  Alcohol/week: 0.0  standard drinks  ? Drug use: No  ? Sexual activity: Not on file  ?Other Topics Concern  ? Not on file  ?Social History Narrative  ? Lives in [place] with spouse.   ?  daughter is grown and healthy, 3 grandchildren  ? Retired Orthoptist as a Engineer, manufacturing systems  ? ?Social Determinants of Health  ? ?Financial Resource Strain: Not on file  ?Food Insecurity: Not on file  ?Transportation Needs: Not on file  ?Physical Activity: Not on file  ?Stress: Not on file  ?Social Connections: Not on file  ? ?Review of systems ?General: negative for malaise, night sweats, fever, chills, weight loss ?Neck: Negative for lumps, goiter, pain and significant neck swelling ?Resp: Negative for cough, wheezing, dyspnea at rest ?CV: Negative for chest pain, leg swelling, palpitations, orthopnea ?GI: denies melena, hematochezia, nausea, vomiting, diarrhea, constipation, dysphagia, odyonophagia, early satiety or unintentional weight loss.  ?MSK: Negative for joint pain or swelling, back pain, and muscle pain. ?Derm: Negative for itching or rash ?Psych: Denies depression, anxiety, memory loss, confusion. No homicidal or suicidal ideation.  ?Heme: Negative for prolonged bleeding, bruising easily, and swollen nodes. ?Endocrine: Negative for cold or heat intolerance, polyuria, polydipsia and goiter. ?Neuro: negative for tremor, gait imbalance, syncope and seizures. ?The remainder of the review of systems is noncontributory. ? ?Physical Exam: ?BP (!) 141/83 (BP Location: Left Arm, Patient Position: Sitting, Cuff Size: Large)   Pulse (!) 59   Temp 97.7 ?F (36.5 ?C) (Oral)   Ht '5\' 5"'$  (1.651 m)   Wt 166 lb 6.4 oz (75.5 kg)   BMI 27.69 kg/m?  ?General:   Alert and oriented. No distress noted. Pleasant and cooperative.  ?Head:  Normocephalic and atraumatic. ?Eyes:  Conjuctiva clear without scleral icterus. ?Mouth:  Oral mucosa pink and moist. Good dentition. No lesions. ?Heart: Normal rate and rhythm, s1 and s2 heart sounds present.  ?Lungs: Clear lung  sounds in all lobes. Respirations equal and unlabored. ?Abdomen:  +BS, soft, non-tender and non-distended. No rebound or guarding. No HSM or masses noted. ?Derm: No palmar erythema or jaundice ?Msk:  Symmetrical without gross deformities. Normal posture. ?Extremities:  Without edema. ?Neurologic:  Alert and  oriented x4 ?Psych:  Alert and cooperative. Normal mood and affect. ? ?Invalid input(s): 6 MONTHS  ? ?ASSESSMENT: ?Bethany Blankenship is a 61 y.o. female presenting today as a new patient for positive hemosure and IDA. ? ?Low iron since late 2022 with last labs in January TIBC 421, Iron 21, Iron saturation 5%, Ferritin 6, B12 614, Folate 4.1, hgb 10.8, MCV 72. Maintained on ferrous sulfate '325mg'$  daily. She does not use NSAIDs or alcohol, notably father had CRC later in life, pt thinks around age 70 or 55. She has had no  previous endoscopic evaluation. No red flag symptoms. Patient denies melena, hematochezia, nausea, vomiting, diarrhea, constipation, dysphagia, odyonophagia, early satiety or weight loss. I discussed recommendations to proceed with EGD and Colonoscopy for further evaluation of IDA and positive fecal occult blood as we cannot rule out PUD, gastritis, AVMs, bleeding polyps or less likely, malignancy.  Indications, risks and benefits of procedure discussed in detail with patient. Patient verbalized understanding and is in agreement to proceed with EGD/Colonoscopy at this time. Will recheck cbc and iron studies today to ensure these are stable. Pt to continue daily iron pills for now.  ? ?PLAN:  ?Schedule EGD and Colonoscopy-on eliquis-endo 1 ?2. Recheck CBC, Iron studies ?3. Continue daily iron pill ?4. Pt to make me aware of rectal bleeding, sob, dizziness, fainting episdoes  ? ?All questions were answered, patient verbalized understanding and is in agreement with plan as outlined above.  ? ? ?Follow Up: ?4 months ? ?Tahj Njoku L. Alver Sorrow, MSN, APRN, AGNP-C ?Adult-Gerontology Nurse Practitioner ?[facility] ? ?

## 2021-09-06 NOTE — Patient Instructions (Signed)
We will get you scheduled for EGD and colonoscopy for further evaluation of your iron deficiency anemia and positive stool testing ?I am also going to recheck your iron studies and blood counts today ?Continue your iron pill at current dose ? ?Please make me aware of any rectal bleeding, shortness of breath, dizziness or episodes of fainting as these can be indications of very low blood counts ?

## 2021-09-07 ENCOUNTER — Other Ambulatory Visit (INDEPENDENT_AMBULATORY_CARE_PROVIDER_SITE_OTHER): Payer: Self-pay

## 2021-09-07 ENCOUNTER — Encounter (INDEPENDENT_AMBULATORY_CARE_PROVIDER_SITE_OTHER): Payer: Self-pay

## 2021-09-07 ENCOUNTER — Telehealth (INDEPENDENT_AMBULATORY_CARE_PROVIDER_SITE_OTHER): Payer: Self-pay

## 2021-09-07 DIAGNOSIS — R195 Other fecal abnormalities: Secondary | ICD-10-CM

## 2021-09-07 DIAGNOSIS — D509 Iron deficiency anemia, unspecified: Secondary | ICD-10-CM

## 2021-09-07 MED ORDER — NA SULFATE-K SULFATE-MG SULF 17.5-3.13-1.6 GM/177ML PO SOLN: 354.0000 mL | Freq: Once | ORAL | 0 refills | Status: AC

## 2021-09-07 NOTE — Telephone Encounter (Signed)
Bethany Blankenship, CMA  ?

## 2021-09-08 ENCOUNTER — Other Ambulatory Visit: Payer: Self-pay | Admitting: "Endocrinology

## 2021-09-08 ENCOUNTER — Telehealth (INDEPENDENT_AMBULATORY_CARE_PROVIDER_SITE_OTHER): Payer: Self-pay

## 2021-09-08 ENCOUNTER — Telehealth: Payer: Self-pay | Admitting: *Deleted

## 2021-09-08 ENCOUNTER — Telehealth: Payer: Self-pay

## 2021-09-08 ENCOUNTER — Encounter (INDEPENDENT_AMBULATORY_CARE_PROVIDER_SITE_OTHER): Payer: Self-pay

## 2021-09-08 LAB — IRON,TIBC AND FERRITIN PANEL
%SAT: 24 % (calc) (ref 16–45)
Ferritin: 32 ng/mL (ref 16–288)
Iron: 75 ug/dL (ref 45–160)
TIBC: 310 mcg/dL (calc) (ref 250–450)

## 2021-09-08 LAB — CBC
HCT: 45.8 % — ABNORMAL HIGH (ref 35.0–45.0)
Hemoglobin: 15 g/dL (ref 11.7–15.5)
MCH: 28 pg (ref 27.0–33.0)
MCHC: 32.8 g/dL (ref 32.0–36.0)
MCV: 85.6 fL (ref 80.0–100.0)
MPV: 9.4 fL (ref 7.5–12.5)
Platelets: 243 10*3/uL (ref 140–400)
RBC: 5.35 10*6/uL — ABNORMAL HIGH (ref 3.80–5.10)
RDW: 18.8 % — ABNORMAL HIGH (ref 11.0–15.0)
WBC: 5.6 10*3/uL (ref 3.8–10.8)

## 2021-09-08 NOTE — Telephone Encounter (Signed)
Bethany Blankenship, CMA  ?

## 2021-09-08 NOTE — Telephone Encounter (Signed)
Pt agreeable to plan of care for tele pre op appt 09/09/21 @ 1 pm. Med rec and consent are done. ?

## 2021-09-08 NOTE — Telephone Encounter (Signed)
Patient with diagnosis of afib on Eliquis for anticoagulation.   ? ?Procedure: COLONOSCOPY/UPPER ENDOSCOPY ?Date of procedure: 10/07/21 ? ?CHA2DS2-VASc Score = 2  ?This indicates a 2.2% annual risk of stroke. ?The patient's score is based upon: ?CHF History: 1 ?HTN History: 0 ?Diabetes History: 0 ?Stroke History: 0 ?Vascular Disease History: 0 ?Age Score: 0 ?Gender Score: 1 ?  ?CrCl 64m/min ?Platelet count 243K ? ?Per office protocol, patient can hold Eliquis for 1-2 days prior to procedure.   ?

## 2021-09-08 NOTE — Telephone Encounter (Signed)
? ?  Pre-operative Risk Assessment  ?  ?Patient Name: Bethany Blankenship  ?DOB: [birth year] ?MRN: [id number]  ? ?  ? ?Request for Surgical Clearance   ? ?Procedure:   COLONOSCOPY/UPPER ENDOSCOPY ? ?Date of Surgery:  Clearance 10/07/21                              ?   ?Surgeon:  DR. DANIEL CASTANEDA ?Surgeon's Group or Practice Name:  [facility] ?Phone number:  [phone] ?Fax number:  [phone] ?  ?Type of Clearance Requested:   ?- Pharmacy:  Hold Apixaban (Eliquis) INSTRUCTION NEEDED ?  ?Type of Anesthesia:  MAC ?  ?Additional requests/questions:   ? ?Signed, ?Jacinta Shoe   ?09/08/2021, 12:45 PM   ?

## 2021-09-08 NOTE — Telephone Encounter (Signed)
Pt agreeable to plan of care for tele pre op appt 09/09/21 @ 1 pm. Med rec and consent are done. ? ?  ?Patient Consent for Virtual Visit  ? ? ?   ? ?Bethany Blankenship has provided verbal consent on 09/08/2021 for a virtual visit (video or telephone). ? ? ?CONSENT FOR VIRTUAL VISIT FOR:  Bethany Blankenship  ?By participating in this virtual visit I agree to the following: ? ?I hereby voluntarily request, consent and authorize [facility] and its employed or contracted physicians, physician assistants, nurse practitioners or other licensed health care professionals (the Practitioner), to provide me with telemedicine health care services (the ?Services") as deemed necessary by the treating Practitioner. I acknowledge and consent to receive the Services by the Practitioner via telemedicine. I understand that the telemedicine visit will involve communicating with the Practitioner through live audiovisual communication technology and the disclosure of certain medical information by electronic transmission. I acknowledge that I have been given the opportunity to request an in-person assessment or other available alternative prior to the telemedicine visit and am voluntarily participating in the telemedicine visit. ? ?I understand that I have the right to withhold or withdraw my consent to the use of telemedicine in the course of my care at any time, without affecting my right to future care or treatment, and that the Practitioner or I may terminate the telemedicine visit at any time. I understand that I have the right to inspect all information obtained and/or recorded in the course of the telemedicine visit and may receive copies of available information for a reasonable fee.  I understand that some of the potential risks of receiving the Services via telemedicine include:  ?Delay or interruption in medical evaluation due to technological equipment failure or disruption; ?Information transmitted may not be sufficient (e.g. poor  resolution of images) to allow for appropriate medical decision making by the Practitioner; and/or  ?In rare instances, security protocols could fail, causing a breach of personal health information. ? ?Furthermore, I acknowledge that it is my responsibility to provide information about my medical history, conditions and care that is complete and accurate to the best of my ability. I acknowledge that Practitioner's advice, recommendations, and/or decision may be based on factors not within their control, such as incomplete or inaccurate data provided by me or distortions of diagnostic images or specimens that may result from electronic transmissions. I understand that the practice of medicine is not an exact science and that Practitioner makes no warranties or guarantees regarding treatment outcomes. I acknowledge that a copy of this consent can be made available to me via my patient portal ([facility]), or I can request a printed copy by calling the office of [facility].   ? ?I understand that my insurance will be billed for this visit.  ? ?I have read or had this consent read to me. ?I understand the contents of this consent, which adequately explains the benefits and risks of the Services being provided via telemedicine.  ?I have been provided ample opportunity to ask questions regarding this consent and the Services and have had my questions answered to my satisfaction. ?I give my informed consent for the services to be provided through the use of telemedicine in my medical care ? ? ? ?

## 2021-09-08 NOTE — Telephone Encounter (Signed)
Carlus Stay Ann Mahaley Schwering, CMA  ?

## 2021-09-08 NOTE — Telephone Encounter (Signed)
Please review request to hold Eliquis (Afib), then will need tele visit.  ?

## 2021-09-08 NOTE — Telephone Encounter (Signed)
? ? ?  Name: BECCI BATTY  ?DOB: 1960/06/26  ?MRN: [id number] ? ?Primary Cardiologist: Rozann Lesches, MD ? ? ?Preoperative team, please contact this patient and set up a phone call appointment for further preoperative risk assessment. Please obtain consent and complete medication review. Thank you for your help. ? ?I confirm that guidance regarding antiplatelet and oral anticoagulation therapy has been completed and, if necessary, noted below. ? ? ? ?Christell Faith, PA-C ?09/08/2021, 2:32 PM ?[facility] ?[address] ?[place], [place] [address] ? ? ?

## 2021-09-09 ENCOUNTER — Ambulatory Visit (INDEPENDENT_AMBULATORY_CARE_PROVIDER_SITE_OTHER): Payer: Commercial Managed Care - PPO | Admitting: Physician Assistant

## 2021-09-09 DIAGNOSIS — Z01810 Encounter for preprocedural cardiovascular examination: Secondary | ICD-10-CM | POA: Diagnosis not present

## 2021-09-09 NOTE — Progress Notes (Signed)
? ?Virtual Visit via Telephone Note  ? ?This visit type was conducted due to national recommendations for restrictions regarding the COVID-19 Pandemic (e.g. social distancing) in an effort to limit this patient's exposure and mitigate transmission in our community.  Due to her co-morbid illnesses, this patient is at least at moderate risk for complications without adequate follow up.  This format is felt to be most appropriate for this patient at this time.  The patient did not have access to video technology/had technical difficulties with video requiring transitioning to audio format only (telephone).  All issues noted in this document were discussed and addressed.  No physical exam could be performed with this format.  Please refer to the patient's chart for her  consent to telehealth for [facility]. ? ?Evaluation Performed:  Preoperative cardiovascular risk assessment ?_____________  ? ?Date:  09/09/2021  ? ?Patient ID:  Bethany Blankenship, DOB [birth year], MRN [id number] ?Patient Location:  ?Home ?Provider location:   ?Office ? ?Primary Care Provider:  Manon Hilding, MD ?Primary Cardiologist:  Rozann Lesches, MD ? ?Chief Complaint  ?  ?61 y.o. y/o female with a h/o Grave's disease s/p thyrotoxic storm and RAI, HFrEF s/p recovery of EF following Afib ablation, persistent Afib on Eliquis s/p ablation, who is pending colonoscopy on 10/07/2021, and presents today for telephonic preoperative cardiovascular risk assessment. ? ?Past Medical History  ?  ?Past Medical History:  ?Diagnosis Date  ? Chronic systolic dysfunction of left ventricle   ? GERD (gastroesophageal reflux disease)   ? Graves' disease with thyrotoxic storm   ? Status post RAI  ? Hypothyroidism   ? Persistent atrial fibrillation ([place])   ? ?Past Surgical History:  ?Procedure Laterality Date  ? ATRIAL FIBRILLATION ABLATION N/A 12/07/2020  ? Procedure: ATRIAL FIBRILLATION ABLATION;  Surgeon: Thompson Grayer, MD;  Location: [facility];  Service:  Cardiovascular;  Laterality: N/A;  ? NO PAST SURGERIES    ? ? ?Allergies ? ?No Known Allergies ? ?History of Present Illness  ?  ?Bethany Blankenship is a 61 y.o. female who presents via audio/video conferencing for a telehealth visit today.  Pt was last seen in cardiology clinic on 06/08/2021 by Malka So, PA-C.  At that time Bethany Blankenship was doing well, without symptoms of angina or decompensation.  The patient is now pending colonoscopy, scheduled for 10/07/2021.  Since her last visit, she has done well and been without symptoms of angina or decompensation.  She is active at baseline.  Her chart has been reviewed by our pharmacy team with recommendation for the patient to hold Eliquis 1-2 days prior to the procedure.  ? ? ?Home Medications  ?  ?Prior to Admission medications   ?Medication Sig Start Date End Date Taking? Authorizing Provider  ?apixaban (ELIQUIS) 5 MG TABS tablet Take 1 tablet (5 mg total) by mouth 2 (two) times daily. 12/16/20   Satira Sark, MD  ?Carollee Sires MG Take 1 tablet by mouth twice daily 06/09/21   Satira Sark, MD  ?ferrous sulfate 325 (65 FE) MG tablet Take 325 mg by mouth daily with breakfast.    [provider]  ?fluticasone (FLONASE) 50 MCG/ACT nasal spray Place 1-2 sprays into both nostrils daily as needed for allergies.    [provider]  ?levothyroxine (SYNTHROID) 50 MCG tablet TAKE 1 TABLET BY MOUTH ONCE DAILY BEFORE BREAKFAST 09/06/21   Cassandria Anger, MD  ?metoprolol succinate (TOPROL XL) 25 MG 24 hr tablet Take 1  tablet (25 mg total) by mouth daily. 06/08/21   Sherran Needs, NP  ?omeprazole (PRILOSEC) 40 MG capsule Take 40 mg by mouth in the morning.    [provider]  ? ? ?Physical Exam  ?  ?Vital Signs:  Bethany Blankenship does not have vital signs available for review today. ? ?Given telephonic nature of communication, physical exam is limited. ?AAOx3. NAD. Normal affect.  Speech and respirations are unlabored. ? ?Accessory  Clinical Findings  ?  ?None ? ?Assessment & Plan  ?  ?1.  Preoperative Cardiovascular Risk Assessment: The patient affirms she has been doing well without any new cardiac symptoms. They are able to achieve > 4 METS without cardiac limitations. RCRI: low risk for noncardiac procedure.  She may hold Eliquis for 1-2 days prior to her colonoscopy with recommendation for this medication to be resumed as soon as safely possible under the direction of the treating provider.  Therefore, based on ACC/AHA guidelines, the patient would be at acceptable risk for the planned procedure without further cardiovascular testing. The patient was advised that if she develops new symptoms prior to surgery to contact our office to arrange for a follow-up visit, and she verbalized understanding. ? ? ? ?A copy of this note will be routed to requesting surgeon. ? ?Time:   ?Today, I have spent 5 minutes with the patient with telehealth technology discussing medical history, symptoms, and management plan.   ? ? ?Christell Faith, PA-C ? ?09/09/2021, 1:02 PM ? ?

## 2021-09-22 ENCOUNTER — Other Ambulatory Visit: Payer: Self-pay

## 2021-09-22 DIAGNOSIS — E890 Postprocedural hypothyroidism: Secondary | ICD-10-CM

## 2021-09-23 ENCOUNTER — Ambulatory Visit: Payer: Commercial Managed Care - PPO | Admitting: "Endocrinology

## 2021-09-30 ENCOUNTER — Encounter: Payer: Self-pay | Admitting: "Endocrinology

## 2021-09-30 ENCOUNTER — Encounter (INDEPENDENT_AMBULATORY_CARE_PROVIDER_SITE_OTHER): Payer: Self-pay

## 2021-09-30 ENCOUNTER — Ambulatory Visit (INDEPENDENT_AMBULATORY_CARE_PROVIDER_SITE_OTHER): Payer: Commercial Managed Care - PPO | Admitting: "Endocrinology

## 2021-09-30 VITALS — BP 92/56 | HR 56 | Ht 65.0 in | Wt 167.0 lb

## 2021-09-30 DIAGNOSIS — E890 Postprocedural hypothyroidism: Secondary | ICD-10-CM

## 2021-09-30 MED ORDER — LEVOTHYROXINE SODIUM 75 MCG PO TABS: 75.0000 ug | ORAL_TABLET | Freq: Every day | ORAL | 2 refills | Status: DC

## 2021-09-30 NOTE — Progress Notes (Signed)
09/30/2021            Endocrinology follow-up note   HPI  Bethany Blankenship is a 61 y.o.-year-old female, she is being seen in follow-up of RAI induced hypothyroidism.  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  Due to palpitation unlikely related to her thyroid problem, the dose of her levothyroxine was significantly decreased in the last few visits while she was undergoing work-up by her cardiologist.   Her previsit labs are improving, however still consistent with under replacement.  She is currently on 50 mcg of levothyroxine.   She denies palpitations at this time.  She reports fatigue and cold intolerance.  ROS: Limited as above. PE: BP (!) 92/56   Pulse (!) 56   Ht '5\' 5"'$  (1.651 m)   Wt 167 lb (75.8 kg)   BMI 27.79 kg/m  Wt Readings from Last 3 Encounters:  09/30/21 167 lb (75.8 kg)  09/06/21 166 lb 6.4 oz (75.5 kg)  07/22/21 165 lb (74.8 kg)    Physical Exam- Limited  Constitutional:  Body mass index is 27.79 kg/m. , not in acute distress, normal state of mind     Recent Results (from the past 2160 hour(s))  TSH     Status: Abnormal   Collection Time: 07/13/21 12:00 AM  Result Value Ref Range   TSH 31.05 (A) 0.41 - 5.90    Comment: Free T4 0.54  CBC     Status: Abnormal   Collection Time: 09/07/21 11:37 AM  Result Value Ref Range   WBC 5.6 3.8 - 10.8 Thousand/uL   RBC 5.35 (H) 3.80 - 5.10 Million/uL   Hemoglobin 15.0 11.7 - 15.5 g/dL   HCT 45.8 (H) 35.0 - 45.0 %   MCV 85.6 80.0 - 100.0 fL   MCH 28.0 27.0 - 33.0 pg   MCHC 32.8 32.0 - 36.0 g/dL   RDW 18.8 (H) 11.0 - 15.0 %   Platelets 243 140 - 400 Thousand/uL   MPV 9.4 7.5 - 12.5 fL  Iron, TIBC and Ferritin Panel     Status: None   Collection Time: 09/07/21 11:37 AM  Result Value Ref Range   Iron 75 45 - 160 mcg/dL   TIBC 310 250 - 450 mcg/dL (calc)   %SAT 24 16 - 45 % (calc)   Ferritin 32 16 - 288 ng/mL    ASSESSMENT: 1. Hypothyroidism induced  by RAI  PLAN:   -She has established diagnosis of RAI induced hypothyroidism.  Based on her labs, she will need a higher dose of levothyroxine.  I discussed and increase her levothyroxine to 75 mcg p.o. daily before breakfast.     - We discussed about the correct intake of her thyroid hormone, on empty stomach at fasting, with water, separated by at least 30 minutes from breakfast and other medications,  and separated by more than 4 hours from calcium, iron, multivitamins, acid reflux medications (PPIs). -Patient is made aware of the fact that thyroid hormone replacement is needed for life, dose to be adjusted by periodic monitoring of thyroid function tests.   She is advised to maintain close follow-up with her PMD for primary care needs.    -Patient may benefit from screening bone density during her annual physical at her PCP office.   I spent 21 minutes in the care of the patient today including review of labs from Thyroid Function, CMP, and other relevant labs ; imaging/biopsy records (current and  previous including abstractions from other facilities); face-to-face time discussing  her lab results and symptoms, medications doses, her options of short and long term treatment based on the latest standards of care / guidelines;   and documenting the encounter.  Bethany Blankenship  participated in the discussions, expressed understanding, and voiced agreement with the above plans.  All questions were answered to her satisfaction. she is encouraged to contact clinic should she have any questions or concerns prior to her return visit.     Glade Lloyd, MD Phone: [phone]  Fax: [phone]  -  This note was partially dictated with voice recognition software. Similar sounding words can be transcribed inadequately or may not  be corrected upon review.  09/30/2021, 2:39 PM

## 2021-10-07 ENCOUNTER — Ambulatory Visit (HOSPITAL_COMMUNITY): Payer: Commercial Managed Care - PPO | Admitting: Anesthesiology

## 2021-10-07 ENCOUNTER — Encounter (HOSPITAL_COMMUNITY)
Admission: RE | Disposition: A | Discharge status: Home / Self Care | Payer: Self-pay | Source: Home / Self Care | Attending: Gastroenterology

## 2021-10-07 ENCOUNTER — Encounter (HOSPITAL_COMMUNITY): Payer: Self-pay | Admitting: Gastroenterology

## 2021-10-07 ENCOUNTER — Other Ambulatory Visit: Payer: Self-pay

## 2021-10-07 ENCOUNTER — Ambulatory Visit (HOSPITAL_BASED_OUTPATIENT_CLINIC_OR_DEPARTMENT_OTHER): Payer: Commercial Managed Care - PPO | Admitting: Anesthesiology

## 2021-10-07 ENCOUNTER — Ambulatory Visit (HOSPITAL_COMMUNITY)
Admission: RE | Admit: 2021-10-07 | Discharge: 2021-10-07 | Disposition: A | Discharge status: Home / Self Care | Payer: Commercial Managed Care - PPO | Attending: Gastroenterology | Admitting: Gastroenterology

## 2021-10-07 DIAGNOSIS — K5730 Diverticulosis of large intestine without perforation or abscess without bleeding: Secondary | ICD-10-CM

## 2021-10-07 DIAGNOSIS — K2289 Other specified disease of esophagus: Secondary | ICD-10-CM | POA: Diagnosis not present

## 2021-10-07 DIAGNOSIS — Z7901 Long term (current) use of anticoagulants: Secondary | ICD-10-CM | POA: Insufficient documentation

## 2021-10-07 DIAGNOSIS — Z79899 Other long term (current) drug therapy: Secondary | ICD-10-CM | POA: Insufficient documentation

## 2021-10-07 DIAGNOSIS — E0500 Thyrotoxicosis with diffuse goiter without thyrotoxic crisis or storm: Secondary | ICD-10-CM | POA: Diagnosis not present

## 2021-10-07 DIAGNOSIS — I5022 Chronic systolic (congestive) heart failure: Secondary | ICD-10-CM | POA: Diagnosis not present

## 2021-10-07 DIAGNOSIS — K635 Polyp of colon: Secondary | ICD-10-CM

## 2021-10-07 DIAGNOSIS — D122 Benign neoplasm of ascending colon: Secondary | ICD-10-CM | POA: Insufficient documentation

## 2021-10-07 DIAGNOSIS — Z923 Personal history of irradiation: Secondary | ICD-10-CM | POA: Diagnosis not present

## 2021-10-07 DIAGNOSIS — K449 Diaphragmatic hernia without obstruction or gangrene: Secondary | ICD-10-CM

## 2021-10-07 DIAGNOSIS — K317 Polyp of stomach and duodenum: Secondary | ICD-10-CM

## 2021-10-07 DIAGNOSIS — I4819 Other persistent atrial fibrillation: Secondary | ICD-10-CM

## 2021-10-07 DIAGNOSIS — Z800 Family history of malignant neoplasm of digestive organs: Secondary | ICD-10-CM | POA: Diagnosis not present

## 2021-10-07 DIAGNOSIS — K648 Other hemorrhoids: Secondary | ICD-10-CM | POA: Diagnosis not present

## 2021-10-07 DIAGNOSIS — D509 Iron deficiency anemia, unspecified: Secondary | ICD-10-CM

## 2021-10-07 DIAGNOSIS — R195 Other fecal abnormalities: Secondary | ICD-10-CM

## 2021-10-07 DIAGNOSIS — K219 Gastro-esophageal reflux disease without esophagitis: Secondary | ICD-10-CM | POA: Insufficient documentation

## 2021-10-07 DIAGNOSIS — E890 Postprocedural hypothyroidism: Secondary | ICD-10-CM

## 2021-10-07 HISTORY — PX: BIOPSY: SHX5522

## 2021-10-07 HISTORY — DX: Cardiac arrhythmia, unspecified: I49.9

## 2021-10-07 HISTORY — PX: COLONOSCOPY WITH PROPOFOL: SHX5780

## 2021-10-07 HISTORY — PX: POLYPECTOMY: SHX5525

## 2021-10-07 HISTORY — PX: ESOPHAGOGASTRODUODENOSCOPY (EGD) WITH PROPOFOL: SHX5813

## 2021-10-07 LAB — HM COLONOSCOPY

## 2021-10-07 SURGERY — COLONOSCOPY WITH PROPOFOL
Anesthesia: General

## 2021-10-07 MED ORDER — PROPOFOL 500 MG/50ML IV EMUL
INTRAVENOUS | Status: DC | PRN
  Administered 2021-10-07: 150 ug/kg/min via INTRAVENOUS

## 2021-10-07 MED ORDER — STERILE WATER FOR IRRIGATION IR SOLN
Status: DC | PRN
  Administered 2021-10-07: 50 mL

## 2021-10-07 MED ORDER — LACTATED RINGERS IV SOLN
INTRAVENOUS | Status: DC | PRN
Start: 2021-10-07 — End: 2021-10-07

## 2021-10-07 MED ORDER — LACTATED RINGERS IV SOLN: INTRAVENOUS | Status: DC

## 2021-10-07 MED ORDER — PROPOFOL 10 MG/ML IV BOLUS
INTRAVENOUS | Status: DC | PRN
  Administered 2021-10-07: 40 mg via INTRAVENOUS
  Administered 2021-10-07: 100 mg via INTRAVENOUS

## 2021-10-07 MED ORDER — LIDOCAINE HCL (CARDIAC) PF 100 MG/5ML IV SOSY
PREFILLED_SYRINGE | INTRAVENOUS | Status: DC | PRN
  Administered 2021-10-07: 60 mg via INTRATRACHEAL

## 2021-10-07 NOTE — Discharge Instructions (Addendum)
You are being discharged to home.  Resume your previous diet.  We are waiting for your pathology results.  Continue ferrous sulfate 325 mg qday. Your physician has recommended a repeat colonoscopy in five years for surveillance.  Restart Eliquis tonight.

## 2021-10-07 NOTE — H&P (Signed)
Bethany Blankenship is an 61 y.o. female.   Chief Complaint: IDA HPI: 61 y/o F with PMH iron deficiency anemia, GERD, systolic heart failure, A-fib on anticoagulation, Graves' disease status post radiation treatment, coming for evaluation of iron deficiency anemia.  Patient denies having any melena, hematochezia, abdominal pain, nausea, vomiting, fever, chills, abdominal distention or pain.  She was found to have iron deficiency anemia in the past and was started on oral iron supplementation -ferrous sulfate 325 mg every day.  Most recent Blood work-up from April 2023 showed resolution of the anemia and improvement of the iron stores.  Notably, family history is significant for colon cancer in her father in his 81s.  Past Medical History:  Diagnosis Date   Chronic systolic dysfunction of left ventricle    Dysrhythmia    GERD (gastroesophageal reflux disease)    Graves' disease with thyrotoxic storm    Status post RAI   Hypothyroidism    Persistent atrial fibrillation ([place])     Past Surgical History:  Procedure Laterality Date   ATRIAL FIBRILLATION ABLATION N/A 12/07/2020   Procedure: ATRIAL FIBRILLATION ABLATION;  Surgeon: Thompson Grayer, MD;  Location: [facility];  Service: Cardiovascular;  Laterality: N/A;   NO PAST SURGERIES      Family History  Problem Relation Age of Onset   CAD Father    Social History:  reports that she has never smoked. She has never used smokeless tobacco. She reports that she does not drink alcohol and does not use drugs.  Allergies: No Known Allergies  Medications Prior to Admission  Medication Sig Dispense Refill   acetaminophen (TYLENOL) 325 MG tablet Take 650 mg by mouth every 6 (six) hours as needed for moderate pain.     apixaban (ELIQUIS) 5 MG TABS tablet Take 1 tablet (5 mg total) by mouth 2 (two) times daily. 180 tablet 3   ENTRESTO 24-26 MG Take 1 tablet by mouth twice daily 180 tablet 1   ferrous sulfate 325 (65 FE) MG tablet Take 325 mg  by mouth daily with breakfast.     fluticasone (FLONASE) 50 MCG/ACT nasal spray Place 1-2 sprays into both nostrils daily as needed for allergies.     levothyroxine (SYNTHROID) 75 MCG tablet Take 1 tablet (75 mcg total) by mouth daily before breakfast. 30 tablet 2   metoprolol succinate (TOPROL XL) 25 MG 24 hr tablet Take 1 tablet (25 mg total) by mouth daily.     omeprazole (PRILOSEC) 40 MG capsule Take 40 mg by mouth in the morning.      No results found for this or any previous visit (from the past 48 hour(s)). No results found.  Review of Systems  Constitutional: Negative.   HENT: Negative.    Eyes: Negative.   Respiratory: Negative.    Cardiovascular: Negative.   Gastrointestinal: Negative.   Endocrine: Negative.   Genitourinary: Negative.   Musculoskeletal: Negative.   Skin: Negative.   Allergic/Immunologic: Negative.   Neurological: Negative.   Hematological: Negative.   Psychiatric/Behavioral: Negative.     Blood pressure 139/84, temperature 98.1 F (36.7 C), temperature source Oral, resp. rate 12, weight 74.8 kg, SpO2 100 %. Physical Exam  GENERAL: The patient is AO x3, in no acute distress. HEENT: Head is normocephalic and atraumatic. EOMI are intact. Mouth is well hydrated and without lesions. NECK: Supple. No masses LUNGS: Clear to auscultation. No presence of rhonchi/wheezing/rales. Adequate chest expansion HEART: RRR, normal s1 and s2. ABDOMEN: Soft, nontender, no guarding, no  peritoneal signs, and nondistended. BS +. No masses. EXTREMITIES: Without any cyanosis, clubbing, rash, lesions or edema. NEUROLOGIC: AOx3, no focal motor deficit. SKIN: no jaundice, no rashes  Assessment/Plan 61 y/o F with PMH iron deficiency anemia, GERD, systolic heart failure, A-fib on anticoagulation, Graves' disease status post radiation treatment, coming for evaluation of iron deficiency anemia.  We will proceed with EGD and colonoscopy.  Harvel Quale, MD 10/07/2021,  8:42 AM

## 2021-10-07 NOTE — Anesthesia Postprocedure Evaluation (Signed)
Anesthesia Post Note  Patient: Bethany Blankenship  Procedure(s) Performed: COLONOSCOPY WITH PROPOFOL ESOPHAGOGASTRODUODENOSCOPY (EGD) WITH PROPOFOL BIOPSY POLYPECTOMY  Patient location during evaluation: Phase II Anesthesia Type: General Level of consciousness: awake and alert and oriented Pain management: pain level controlled Vital Signs Assessment: post-procedure vital signs reviewed and stable Respiratory status: spontaneous breathing, nonlabored ventilation and respiratory function stable Cardiovascular status: blood pressure returned to baseline and stable Postop Assessment: no apparent nausea or vomiting Anesthetic complications: no   No notable events documented.   Last Vitals:  Vitals:   10/07/21 0814 10/07/21 0931  BP: 139/84 (!) 97/43  Resp: 12 12  Temp: 36.7 C 36.4 C  SpO2: 100% 97%    Last Pain:  Vitals:   10/07/21 0931  TempSrc: Oral  PainSc: 0-No pain                 [name] C Mckenlee Mangham

## 2021-10-07 NOTE — Transfer of Care (Signed)
Immediate Anesthesia Transfer of Care Note  Patient: Bethany Blankenship  Procedure(s) Performed: COLONOSCOPY WITH PROPOFOL ESOPHAGOGASTRODUODENOSCOPY (EGD) WITH PROPOFOL BIOPSY POLYPECTOMY  Patient Location: Short Stay  Anesthesia Type:General  Level of Consciousness: awake  Airway & Oxygen Therapy: Patient Spontanous Breathing  Post-op Assessment: Report given to RN and Post -op Vital signs reviewed and stable  Post vital signs: Reviewed and stable  Last Vitals:  Vitals Value Taken Time  BP 97/43 10/07/21 0931  Temp 36.4 C 10/07/21 0931  Pulse    Resp 12 10/07/21 0931  SpO2 97 % 10/07/21 0931    Last Pain:  Vitals:   10/07/21 0931  TempSrc: Oral  PainSc: 0-No pain      Patients Stated Pain Goal: 6 (45/36/46 8032)  Complications: No notable events documented.

## 2021-10-07 NOTE — Op Note (Addendum)
[facility] Patient Name: Bethany Blankenship Procedure Date: 10/07/2021 8:46 AM MRN: [id number] Date of Birth: 1960-10-24 Attending MD: Maylon Peppers ,  CSN: [id number] Age: 61 Admit Type: Outpatient Procedure:                Colonoscopy Indications:              Iron deficiency anemia Providers:                Maylon Peppers, Selena Lesser RN, RN, Bonnetta Barry, Technician Referring MD:              Medicines:                Monitored Anesthesia Care Complications:            No immediate complications. Estimated Blood Loss:     Estimated blood loss: none. Procedure:                Pre-Anesthesia Assessment:                           - Prior to the procedure, a History and Physical                            was performed, and patient medications, allergies                            and sensitivities were reviewed. The patient's                            tolerance of previous anesthesia was reviewed.                           - The risks and benefits of the procedure and the                            sedation options and risks were discussed with the                            patient. All questions were answered and informed                            consent was obtained.                           - ASA Grade Assessment: II - A patient with mild                            systemic disease.                           After obtaining informed consent, the colonoscope                            was passed under direct vision. Throughout the  procedure, the patient's blood pressure, pulse, and                            oxygen saturations were monitored continuously. The                            [id number]-[id number] ([id number]) scope was introduced through                            the anus and advanced to the the terminal ileum.                            The colonoscopy was performed without difficulty.                            The  patient tolerated the procedure well. The                            quality of the bowel preparation was good. The                            [id number]) scope was introduced through the                            and advanced to the. Scope In: 9:09:44 AM Scope Out: 9:27:22 AM Scope Withdrawal Time: 0 hours 10 minutes 48 seconds  Total Procedure Duration: 0 hours 17 minutes 38 seconds  Findings:      The perianal and digital rectal examinations were normal.      The terminal ileum appeared normal.      A 6 mm polyp was found in the proximal ascending colon. The polyp was       sessile. The polyp was removed with a cold snare. Resection and       retrieval were complete.      Multiple small and large-mouthed diverticula were found in the sigmoid       colon, descending colon and transverse colon.      Non-bleeding internal hemorrhoids were found during retroflexion. The       hemorrhoids were small. Impression:               - The examined portion of the ileum was normal.                           - One 6 mm polyp in the proximal ascending colon,                            removed with a cold snare. Resected and retrieved.                           - Diverticulosis in the sigmoid colon, in the                            descending colon and in the transverse colon.                           -  Non-bleeding internal hemorrhoids. Moderate Sedation:      Per Anesthesia Care Recommendation:           - Discharge patient to home (ambulatory).                           - Resume previous diet.                           - Await pathology results.                           - Repeat colonoscopy in 5 years for surveillance.                           - Restart Eliquis tonight.                           - If recurrent anemia and low iron despite PO iron                            supplementation, will proceed with capsule endoscopy Procedure Code(s):        --- Professional ---                            [phone], Colonoscopy, flexible; with removal of                            tumor(s), polyp(s), or other lesion(s) by snare                            technique Diagnosis Code(s):        --- Professional ---                           K64.8, Other hemorrhoids                           K63.5, Polyp of colon                           D50.9, Iron deficiency anemia, unspecified                           K57.30, Diverticulosis of large intestine without                            perforation or abscess without bleeding CPT copyright 2019 American [employer]. All rights reserved. The codes documented in this report are preliminary and upon coder review may  be revised to meet current compliance requirements. Maylon Peppers, MD Maylon Peppers,  10/07/2021 9:34:47 AM This report has been signed electronically. Number of Addenda: 0

## 2021-10-07 NOTE — Anesthesia Preprocedure Evaluation (Addendum)
Anesthesia Evaluation  Patient identified by MRN, date of birth, ID band Patient awake    Reviewed: Allergy & Precautions, NPO status , Patient's Chart, lab work & pertinent test results, reviewed documented beta blocker date and time   Airway Mallampati: III  TM Distance: >3 FB Neck ROM: Full    Dental  (+) Dental Advisory Given, Teeth Intact   Pulmonary neg pulmonary ROS,    Pulmonary exam normal breath sounds clear to auscultation       Cardiovascular Exercise Tolerance: Good (-) hypertensionPt. on home beta blockers Normal cardiovascular exam+ dysrhythmias (s/p ablation) Atrial Fibrillation  Rhythm:Regular Rate:Normal  Satira Sark, MD  03/28/2021 10:36 AM EST   Results reviewed. LVEF has completely normalized at this point, 65 to 70%, very nice result. Continue current medications and keep follow-up for further discussion.     Neuro/Psych negative neurological ROS  negative psych ROS   GI/Hepatic Neg liver ROS, GERD  Medicated,  Endo/Other  Hypothyroidism   Renal/GU negative Renal ROS  negative genitourinary   Musculoskeletal negative musculoskeletal ROS (+)   Abdominal   Peds negative pediatric ROS (+)  Hematology  (+) Blood dyscrasia, anemia ,   Anesthesia Other Findings   Reproductive/Obstetrics negative OB ROS                            Anesthesia Physical Anesthesia Plan  ASA: 2  Anesthesia Plan: General   Post-op Pain Management: Minimal or no pain anticipated   Induction: Intravenous  PONV Risk Score and Plan: Propofol infusion  Airway Management Planned: Nasal Cannula and Natural Airway  Additional Equipment:   Intra-op Plan:   Post-operative Plan:   Informed Consent: I have reviewed the patients History and Physical, chart, labs and discussed the procedure including the risks, benefits and alternatives for the proposed anesthesia with the patient or  authorized representative who has indicated his/her understanding and acceptance.     Dental advisory given  Plan Discussed with: CRNA and Surgeon  Anesthesia Plan Comments:        Anesthesia Quick Evaluation

## 2021-10-07 NOTE — Op Note (Signed)
[facility] Patient Name: Bethany Blankenship Procedure Date: 10/07/2021 8:44 AM MRN: [id number] Date of Birth: Nov 26, 1960 Attending MD: Maylon Peppers ,  CSN: [id number] Age: 61 Admit Type: Outpatient Procedure:                Upper GI endoscopy Indications:              Iron deficiency anemia Providers:                Maylon Peppers, Charlsie Quest. Theda Sers RN, RN, Bonnetta Barry, Technician Referring MD:              Medicines:                Monitored Anesthesia Care Complications:            No immediate complications. Estimated Blood Loss:     Estimated blood loss: none. Procedure:                Pre-Anesthesia Assessment:                           - Prior to the procedure, a History and Physical                            was performed, and patient medications, allergies                            and sensitivities were reviewed. The patient's                            tolerance of previous anesthesia was reviewed.                           - The risks and benefits of the procedure and the                            sedation options and risks were discussed with the                            patient. All questions were answered and informed                            consent was obtained.                           - ASA Grade Assessment: II - A patient with mild                            systemic disease.                           After obtaining informed consent, the endoscope was                            passed under direct vision. Throughout the  procedure, the patient's blood pressure, pulse, and                            oxygen saturations were monitored continuously. The                            [id number]-[id number] ([id number]) scope was introduced through the                            mouth, and advanced to the third part of duodenum.                            The upper GI endoscopy was accomplished without                             difficulty. The patient tolerated the procedure                            well. Scope In: 8:52:50 AM Scope Out: 9:02:34 AM Total Procedure Duration: 0 hours 9 minutes 44 seconds  Findings:      The Z-line was irregular and was found 34 cm from the incisors. Biopsies       were taken with a cold forceps for histology.      A 5 cm hiatal hernia was found. The proximal extent of the gastric folds       (end of tubular esophagus) was 34 cm from the incisors. The hiatal       narrowing was 39 cm from the incisors. The Z-line was 34 cm from the       incisors.      Multiple medium sessile fundic gland polyps were found in the gastric       fundus.      The examined duodenum was normal. Biopsies were taken with a cold       forceps for histology. Impression:               - Z-line irregular, 34 cm from the incisors.                            Biopsied.                           - 5 cm hiatal hernia.                           - Multiple fundic gland polyps.                           - Normal examined duodenum. Biopsied. Moderate Sedation:      Per Anesthesia Care Recommendation:           - Discharge patient to home (ambulatory).                           - Resume previous diet.                           - Await pathology results.                           -  Continue ferous sulfate 325 mg qday. Procedure Code(s):        --- Professional ---                           [phone], Esophagogastroduodenoscopy, flexible,                            transoral; with biopsy, single or multiple Diagnosis Code(s):        --- Professional ---                           K22.8, Other specified diseases of esophagus                           K44.9, Diaphragmatic hernia without obstruction or                            gangrene                           K31.7, Polyp of stomach and duodenum                           D50.9, Iron deficiency anemia, unspecified CPT copyright 2019 American Medical Association.  All rights reserved. The codes documented in this report are preliminary and upon coder review may  be revised to meet current compliance requirements. Maylon Peppers, MD Maylon Peppers,  10/07/2021 9:30:43 AM This report has been signed electronically. Number of Addenda: 0

## 2021-10-11 ENCOUNTER — Encounter (INDEPENDENT_AMBULATORY_CARE_PROVIDER_SITE_OTHER): Payer: Self-pay | Admitting: *Deleted

## 2021-10-11 LAB — SURGICAL PATHOLOGY

## 2021-10-13 ENCOUNTER — Encounter (HOSPITAL_COMMUNITY): Payer: Self-pay | Admitting: Gastroenterology

## 2021-10-13 ENCOUNTER — Encounter (INDEPENDENT_AMBULATORY_CARE_PROVIDER_SITE_OTHER): Payer: Self-pay | Admitting: *Deleted

## 2021-10-14 ENCOUNTER — Ambulatory Visit: Payer: Commercial Managed Care - PPO | Admitting: "Endocrinology

## 2021-11-09 ENCOUNTER — Ambulatory Visit: Payer: Commercial Managed Care - PPO | Admitting: Cardiology

## 2021-11-13 NOTE — Progress Notes (Unsigned)
Cardiology Office Note  Date: 11/16/2021   ID: Bethany Blankenship, DOB [birth year], MRN [id number]  PCP:  Manon Hilding, MD  Cardiologist:  Rozann Lesches, MD Electrophysiologist:  None   Chief Complaint  Patient presents with   Cardiac follow-up    History of Present Illness: Bethany Blankenship is a 61 y.o. female last assessed in April by Mr. Purcell Mouton, I reviewed the note.  She is here for a routine visit, doing well overall from a cardiac perspective.  She does feel intermittent palpitations, no prolonged events.  She is status post atrial fibrillation ablation in July of last year.  I reviewed her medications, she remains on Eliquis for stroke prophylaxis, also Toprol-XL and Entresto with prior history of cardiomyopathy.  LVEF had normalized, vigorous in the range of 65 to 70% as of November 2022.  She is clinically stable with NYHA class II dyspnea.  Past Medical History:  Diagnosis Date   Chronic systolic dysfunction of left ventricle    GERD (gastroesophageal reflux disease)    Graves' disease with thyrotoxic storm    Status post RAI   Hypothyroidism    Persistent atrial fibrillation ([place])     Past Surgical History:  Procedure Laterality Date   ATRIAL FIBRILLATION ABLATION N/A 12/07/2020   Procedure: ATRIAL FIBRILLATION ABLATION;  Surgeon: Thompson Grayer, MD;  Location: [facility];  Service: Cardiovascular;  Laterality: N/A;   BIOPSY  10/07/2021   Procedure: BIOPSY;  Surgeon: Harvel Quale, MD;  [facility];  Service: Gastroenterology;;   COLONOSCOPY WITH PROPOFOL N/A 10/07/2021   Procedure: COLONOSCOPY WITH PROPOFOL;  Surgeon: Harvel Quale, MD;  [facility];  Service: Gastroenterology;  Laterality: N/A;  935   ESOPHAGOGASTRODUODENOSCOPY (EGD) WITH PROPOFOL N/A 10/07/2021   Procedure: ESOPHAGOGASTRODUODENOSCOPY (EGD) WITH PROPOFOL;  Surgeon: Harvel Quale, MD;  [facility];  Service: Gastroenterology;   Laterality: N/A;   NO PAST SURGERIES     POLYPECTOMY  10/07/2021   Procedure: POLYPECTOMY;  Surgeon: Montez Morita, Quillian Quince, MD;  [facility];  Service: Gastroenterology;;    Current Outpatient Medications  Medication Sig Dispense Refill   acetaminophen (TYLENOL) 325 MG tablet Take 650 mg by mouth every 6 (six) hours as needed for moderate pain.     apixaban (ELIQUIS) 5 MG TABS tablet Take 1 tablet (5 mg total) by mouth 2 (two) times daily. 180 tablet 3   ENTRESTO 24-26 MG Take 1 tablet by mouth twice daily 180 tablet 1   fluticasone (FLONASE) 50 MCG/ACT nasal spray Place 1-2 sprays into both nostrils daily as needed for allergies.     levothyroxine (SYNTHROID) 75 MCG tablet Take 1 tablet (75 mcg total) by mouth daily before breakfast. 30 tablet 2   metoprolol succinate (TOPROL XL) 25 MG 24 hr tablet Take 1 tablet (25 mg total) by mouth daily.     omeprazole (PRILOSEC) 40 MG capsule Take 40 mg by mouth in the morning.     ferrous sulfate 325 (65 FE) MG tablet Take 325 mg by mouth daily with breakfast. (Patient not taking: Reported on 11/16/2021)     No current facility-administered medications for this visit.   Allergies:  Patient has no known allergies.   ROS: No orthopnea or PND, no syncope.  Physical Exam: VS:  BP (!) 142/88   Pulse (!) 54   Ht '5\' 5"'$  (1.651 m)   Wt 165 lb 8 oz (75.1 kg)   SpO2 98%  BMI 27.54 kg/m , BMI Body mass index is 27.54 kg/m.  Wt Readings from Last 3 Encounters:  11/16/21 165 lb 8 oz (75.1 kg)  10/07/21 165 lb (74.8 kg)  09/30/21 167 lb (75.8 kg)    General: Patient appears comfortable at rest. HEENT: Conjunctiva and lids normal, oropharynx clear Lungs: Clear to auscultation, nonlabored breathing at rest. Cardiac: Regular rate and rhythm, no S3 or significant systolic murmur. Extremities: No pitting edema.  ECG:  An ECG dated 06/08/2021 was personally reviewed today and demonstrated:  Sinus bradycardia with decreased R wave  progression.  Recent Labwork: 11/17/2020: BUN 14; Creatinine, Ser 0.96; Potassium 5.2; Sodium 138 07/13/2021: TSH 31.05 09/07/2021: Hemoglobin 15.0; Platelets 243   Other Studies Reviewed Today:  Echocardiogram 03/28/2021:  1. Left ventricular ejection fraction, by estimation, is 65 to 70%. The  left ventricle has normal function. The left ventricle has no regional  wall motion abnormalities. Left ventricular diastolic parameters were  normal.   2. Right ventricular systolic function is normal. The right ventricular  size is normal. Tricuspid regurgitation signal is inadequate for assessing  PA pressure.   3. Left atrial size was mildly dilated.   4. The mitral valve is grossly normal. Trivial mitral valve  regurgitation.   5. The aortic valve is tricuspid. Aortic valve regurgitation is trivial.   6. The inferior vena cava is normal in size with greater than 50%  respiratory variability, suggesting right atrial pressure of 3 mmHg.   Assessment and Plan:  1.  Persistent atrial fibrillation with CHA2DS2-VASc score of 2 status post radiofrequency ablation in July 2022.  Reports intermittent palpitations, none prolonged.  Heart rate is regular today.  Continue Eliquis for stroke prophylaxis along with Toprol-XL.  2.  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  NYHA class II dyspnea.  Continue with observation for now.  3.  Elevated blood pressure.  No standing hypertension history.  Recommended that she get a cuff to check blood pressure periodically at home.  Medication Adjustments/Labs and Tests Ordered: Current medicines are reviewed at length with the patient today.  Concerns regarding medicines are outlined above.   Tests Ordered: No orders of the defined types were placed in this encounter.   Medication Changes: No orders of the defined types were placed in this encounter.   Disposition:  Follow up  6  months.  Signed, Satira Sark, MD, [facility] 11/16/2021 11:19 AM    [facility] [place], [place], [place] [address] Phone: [phone]; Fax: [phone]

## 2021-11-16 ENCOUNTER — Encounter: Payer: Self-pay | Admitting: Cardiology

## 2021-11-16 ENCOUNTER — Ambulatory Visit (INDEPENDENT_AMBULATORY_CARE_PROVIDER_SITE_OTHER): Payer: Commercial Managed Care - PPO | Admitting: Cardiology

## 2021-11-16 VITALS — BP 142/88 | HR 54 | Ht 65.0 in | Wt 165.5 lb

## 2021-11-16 DIAGNOSIS — Z8679 Personal history of other diseases of the circulatory system: Secondary | ICD-10-CM

## 2021-11-16 DIAGNOSIS — I4819 Other persistent atrial fibrillation: Secondary | ICD-10-CM | POA: Diagnosis not present

## 2021-11-16 NOTE — Patient Instructions (Signed)
Medication Instructions:  Continue all current medications.   Labwork: none  Testing/Procedures: none  Follow-Up: 6 months   Any Other Special Instructions Will Be Listed Below (If Applicable).   If you need a refill on your cardiac medications before your next appointment, please call your pharmacy.  

## 2021-12-03 ENCOUNTER — Other Ambulatory Visit: Payer: Self-pay | Admitting: Cardiology

## 2022-01-04 ENCOUNTER — Ambulatory Visit: Payer: Commercial Managed Care - PPO | Admitting: "Endocrinology

## 2022-01-05 ENCOUNTER — Ambulatory Visit (INDEPENDENT_AMBULATORY_CARE_PROVIDER_SITE_OTHER): Payer: Commercial Managed Care - PPO | Admitting: Gastroenterology

## 2022-01-05 ENCOUNTER — Encounter (INDEPENDENT_AMBULATORY_CARE_PROVIDER_SITE_OTHER): Payer: Self-pay | Admitting: Gastroenterology

## 2022-01-05 VITALS — BP 126/77 | HR 45 | Temp 98.2°F | Ht 65.0 in | Wt 171.9 lb

## 2022-01-05 DIAGNOSIS — D509 Iron deficiency anemia, unspecified: Secondary | ICD-10-CM | POA: Diagnosis not present

## 2022-01-05 NOTE — Patient Instructions (Signed)
It was nice to see you! Labs all look good, we will continue to monitor for any reoccurrence of iron deficiency anemia. If you have fatigue, shortness of breath, dizziness, rectal bleeding or black stools, please make me aware  We will see you again in 6 months

## 2022-01-05 NOTE — Progress Notes (Signed)
Referring Provider: Manon Hilding, MD Primary Care Physician:  Manon Hilding, MD Primary GI Physician:   Chief Complaint  Patient presents with   Anemia    4 month follow up on IDA and blood in stool.has not seen any blood in stool and had recent labs with pcp about 2 weeks ago and brought in a copy of labs. Stopped taking iron about 2 weeks after colonoscopy.    HPI:   DARE SPILLMAN is a 61 y.o. female with past medical history of GERD, Grave's disease, A fib on eliquis, systolic d/f of L ventricle  Patient presenting today for follow up of IDA  Labs in January with TIBC 421, Iron 21, Iron saturation 5%, Ferritin 6, B12 614, Folate 4.1, hgb 10.8, MCV 72, pt started on ferrous sulfate '325mg'$  once daily.   Last seen April 2023, at that time had previously positive hemosure in sept 2022, having some mild fatigue. Denied rectal bleeding or melena. Recommended continue daily iron pill, recheck cbc and iron and schedule egd and TCS.  Most recent labs done 12/23/21 with hgb 14.8, TIBC 322, Iron 66, sat 20 and ferritin 41 off of PO iron  Present: States she stopped iron shortly after TCS. She reports feeling good. denies rectal bleeding or melena. Having BM once per day without constipation or diarrhea. Denies sob dizziness or fatigue. Denies weight loss or changes in appetite. Denies abdominal pain, nausea, vomiting. Is having labs done again with PCP in 6 months for monitoring of her hemoglobin and iron.    Last Colonoscopy:10/07/21 - The examined portion of the ileum was normal. - One 6 mm polyp in the proximal ascending colon, removed with a cold snare. Resected and retrieved.-TA - Diverticulosis in the sigmoid colon, in the descending colon and in the transverse colon. - Non-bleeding internal hemorrhoids. Last Endoscopy:10/07/21 - Z-line irregular, 34 cm from the incisors. Biopsied. - 5 cm hiatal hernia. - Multiple fundic gland polyps. - Normal examined duodenum.  Biopsied-normal  Recommendations:  Repeat TCS 5 years  Past Medical History:  Diagnosis Date   Chronic systolic dysfunction of left ventricle    GERD (gastroesophageal reflux disease)    Graves' disease with thyrotoxic storm    Status post RAI   Hypothyroidism    Persistent atrial fibrillation ([place])     Past Surgical History:  Procedure Laterality Date   ATRIAL FIBRILLATION ABLATION N/A 12/07/2020   Procedure: ATRIAL FIBRILLATION ABLATION;  Surgeon: Thompson Grayer, MD;  Location: [facility];  Service: Cardiovascular;  Laterality: N/A;   BIOPSY  10/07/2021   Procedure: BIOPSY;  Surgeon: Harvel Quale, MD;  [facility];  Service: Gastroenterology;;   COLONOSCOPY WITH PROPOFOL N/A 10/07/2021   Procedure: COLONOSCOPY WITH PROPOFOL;  Surgeon: Harvel Quale, MD;  [facility];  Service: Gastroenterology;  Laterality: N/A;  935   ESOPHAGOGASTRODUODENOSCOPY (EGD) WITH PROPOFOL N/A 10/07/2021   Procedure: ESOPHAGOGASTRODUODENOSCOPY (EGD) WITH PROPOFOL;  Surgeon: Harvel Quale, MD;  [facility];  Service: Gastroenterology;  Laterality: N/A;   NO PAST SURGERIES     POLYPECTOMY  10/07/2021   Procedure: POLYPECTOMY;  Surgeon: Montez Morita, Quillian Quince, MD;  [facility];  Service: Gastroenterology;;    Current Outpatient Medications  Medication Sig Dispense Refill   acetaminophen (TYLENOL) 325 MG tablet Take 650 mg by mouth every 6 (six) hours as needed for moderate pain.     ELIQUIS 5 MG TABS tablet Take 1 tablet by mouth  twice daily 180 tablet 0   ENTRESTO 24-26 MG Take 1 tablet by mouth twice daily 180 tablet 1   fluticasone (FLONASE) 50 MCG/ACT nasal spray Place 1-2 sprays into both nostrils daily as needed for allergies.     levothyroxine (SYNTHROID) 75 MCG tablet Take 1 tablet (75 mcg total) by mouth daily before breakfast. 30 tablet 2   metoprolol succinate (TOPROL XL) 25 MG 24 hr tablet Take 1 tablet  (25 mg total) by mouth daily.     omeprazole (PRILOSEC) 40 MG capsule Take 40 mg by mouth in the morning.     No current facility-administered medications for this visit.    Allergies as of 01/05/2022   (No Known Allergies)    Family History  Problem Relation Age of Onset   CAD Father     Social History   Socioeconomic History   Marital status: Married    Spouse name: Not on file   Number of children: Not on file   Years of education: Not on file   Highest education level: Not on file  Occupational History   Not on file  Tobacco Use   Smoking status: Never    Passive exposure: Past   Smokeless tobacco: Never  Vaping Use   Vaping Use: Never used  Substance and Sexual Activity   Alcohol use: No    Alcohol/week: 0.0 standard drinks of alcohol   Drug use: No   Sexual activity: Not on file  Other Topics Concern   Not on file  Social History Narrative   Lives in [place] with spouse.     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  MSK: Negative for joint pain or swelling, back pain, and muscle pain. Derm: Negative for itching or rash Psych: Denies depression, anxiety, memory loss, confusion. No homicidal or suicidal ideation.  Heme: Negative for prolonged bleeding, bruising easily,  and swollen nodes. Endocrine: Negative for cold or heat intolerance, polyuria, polydipsia and goiter. Neuro: negative for tremor, gait imbalance, syncope and seizures. The remainder of the review of systems is noncontributory.  Physical Exam: BP 126/77 (BP Location: Left Arm, Patient Position: Sitting, Cuff Size: Large)   Pulse (!) 45   Temp 98.2 F (36.8 C) (Oral)   Ht '5\' 5"'$  (1.651 m)   Wt 171 lb 14.4 oz (78 kg)   BMI 28.61 kg/m  General:   Alert and oriented. No distress noted. Pleasant and cooperative.  Head:  Normocephalic and atraumatic. Eyes:  Conjuctiva clear without scleral icterus. Mouth:  Oral mucosa pink and moist. Good dentition. No lesions. Heart: Normal rate and rhythm, s1 and s2 heart sounds present.  Lungs: Clear lung sounds in all lobes. Respirations equal and unlabored. Abdomen:  +BS, soft, non-tender and non-distended. No rebound or guarding. No HSM or masses noted. Derm: No palmar erythema or jaundice Msk:  Symmetrical without  gross deformities. Normal posture. Extremities:  Without edema. Neurologic:  Alert and  oriented x4 Psych:  Alert and cooperative. Normal mood and affect.  Invalid input(s): "6 MONTHS"   ASSESSMENT: LARITA DEREMER is a 61 y.o. female presenting today for follow up of IDA  Positive Hemosure in September 2022 with PCP, Labs in January with TIBC 421, Iron 21, Iron saturation 5%, Ferritin 6, B12 614, Folate 4.1, hgb 10.8, MCV 72, pt started on ferrous sulfate '325mg'$  once daily. EGD and Colonoscopy done in May 2023 were unremarkable for source of bleeding. Most recent labs done 12/23/21 with hgb 14.8, TIBC 322, Iron 66, sat 20 and ferritin 41 off of PO iron. She is doing well today, has no GI complaints. No red flag symptoms. Patient denies melena, hematochezia, nausea, vomiting, diarrhea, constipation, dysphagia, odyonophagia, early satiety or weight loss. Recommend she continue with iron rich diet, she will make me aware of new GI symptoms,  fatigue, sob or dizziness. May consider capsule study if further IDA occurs. Having labs with PCP again in early February.     PLAN:  Iron rich diet 2. Pt to make me aware of fatigue, sob, dizziness, rectal bleeding or melena  3. Consider capsule study if reoccurrence of IDA  All questions were answered, patient verbalized understanding and is in agreement with plan as outlined above.   Follow Up: 6 months  Talon Regala L. Alver Sorrow, MSN, APRN, AGNP-C Adult-Gerontology Nurse Practitioner [facility]

## 2022-01-25 ENCOUNTER — Ambulatory Visit: Payer: Commercial Managed Care - PPO | Admitting: "Endocrinology

## 2022-02-10 ENCOUNTER — Ambulatory Visit: Payer: Commercial Managed Care - PPO | Admitting: "Endocrinology

## 2022-02-15 ENCOUNTER — Ambulatory Visit (INDEPENDENT_AMBULATORY_CARE_PROVIDER_SITE_OTHER): Payer: Commercial Managed Care - PPO | Admitting: "Endocrinology

## 2022-02-15 ENCOUNTER — Encounter: Payer: Self-pay | Admitting: "Endocrinology

## 2022-02-15 VITALS — BP 118/78 | HR 48 | Ht 65.0 in | Wt 171.2 lb

## 2022-02-15 DIAGNOSIS — E890 Postprocedural hypothyroidism: Secondary | ICD-10-CM

## 2022-02-15 MED ORDER — LEVOTHYROXINE SODIUM 88 MCG PO TABS: 88.0000 ug | ORAL_TABLET | Freq: Every day | ORAL | 1 refills | Status: DC

## 2022-02-15 NOTE — Progress Notes (Signed)
    02/15/2022            Endocrinology follow-up note   HPI  Bethany Blankenship is a 61 y.o.-year-old female, she is being seen in follow-up of RAI induced hypothyroidism.  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  Due to palpitation unlikely related to her thyroid problem, the dose of her levothyroxine was significantly decreased in the last few visits while she was undergoing work-up by her cardiologist.   Her previsit labs are improving, but still consistent with under replacement.  She is currently on levothyroxine 75 mcg p.o. daily before breakfast.    She denies palpitations at this time.  She reports fatigue and cold intolerance.  ROS: Limited as above. PE: BP 118/78   Pulse (!) 48   Ht '5\' 5"'$  (1.651 m)   Wt 171 lb 3.2 oz (77.7 kg)   BMI 28.49 kg/m  Wt Readings from Last 3 Encounters:  02/15/22 171 lb 3.2 oz (77.7 kg)  01/05/22 171 lb 14.4 oz (78 kg)  11/16/21 165 lb 8 oz (75.1 kg)    Physical Exam- Limited  Constitutional:  Body mass index is 28.49 kg/m. , not in acute distress, normal state of mind     No results found for this or any previous visit (from the past 2160 hour(s)).   ASSESSMENT: 1. 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  Based on her labs, she will need a higher dose of levothyroxine.  I discussed and increase her levothyroxine to 88 mcg p.o. daily before breakfast.    - We discussed about the correct intake of her thyroid hormone, on empty stomach at fasting, with water, separated by at least 30 minutes from breakfast and other medications,  and separated by more than 4 hours from calcium, iron, multivitamins, acid reflux medications (PPIs). -Patient is made aware of the fact that thyroid hormone replacement is needed for life, dose to be adjusted by periodic monitoring of thyroid function tests.    She is advised to maintain close  follow-up with her PMD for primary care needs.    -Patient may benefit from screening bone density during her annual physical at her PCP office.   I spent 21 minutes in the care of the patient today including review of labs from Thyroid Function, CMP, and other relevant labs ; imaging/biopsy records (current and previous including abstractions from other facilities); face-to-face time discussing  her lab results and symptoms, medications doses, her options of short and long term treatment based on the latest standards of care / guidelines;   and documenting the encounter.  [name]  participated in the discussions, expressed understanding, and voiced agreement with the above plans.  All questions were answered to her satisfaction. she is encouraged to contact clinic should she have any questions or concerns prior to her return visit.     Glade Lloyd, MD Phone: [phone]  Fax: [phone]  -  This note was partially dictated with voice recognition software. Similar sounding words can be transcribed inadequately or may not  be corrected upon review.  02/15/2022, 1:05 PM

## 2022-03-19 ENCOUNTER — Other Ambulatory Visit: Payer: Self-pay | Admitting: Cardiology

## 2022-03-20 NOTE — Telephone Encounter (Addendum)
Prescription refill request for Eliquis received. Indication: AF Last office visit: 11/16/21  Myles Gip MD Scr: 0.96 on 11/17/20 Age: 61 Weight: 75.1kg  Based on above findings Eliquis '5mg'$  twice daily is the appropriate dose.  Refill approved.

## 2022-03-22 ENCOUNTER — Telehealth: Payer: Self-pay | Admitting: Cardiology

## 2022-03-22 NOTE — Telephone Encounter (Signed)
No answer

## 2022-03-22 NOTE — Telephone Encounter (Signed)
Pt tried to use the Eliquis copay card and the pharmacy told her it was expired because they couldn't get it to go through- she was wondering if she could get another card

## 2022-03-24 NOTE — Telephone Encounter (Signed)
Attempt to reach,phone just rings, no voicemail

## 2022-05-19 ENCOUNTER — Ambulatory Visit: Payer: Commercial Managed Care - PPO | Admitting: "Endocrinology

## 2022-05-24 ENCOUNTER — Ambulatory Visit: Payer: Commercial Managed Care - PPO | Admitting: "Endocrinology

## 2022-05-30 ENCOUNTER — Encounter: Payer: Self-pay | Admitting: Cardiology

## 2022-05-30 NOTE — Progress Notes (Signed)
Cardiology Office Note  Date: 05/31/2022   ID: Bethany Blankenship, DOB 04/26/1961, MRN [id number]  PCP:  Manon Hilding, MD  Cardiologist:  Rozann Lesches, MD Electrophysiologist:  None   Chief Complaint  Patient presents with   Cardiac follow-up    History of Present Illness: Bethany Blankenship is a 62 y.o. female last seen in July 2023.  She is here for a routine visit.  States that she feels a sense of palpitations and recurrent atrial fibrillation typically around times that her thyroid regimen is adjusted.  She recently felt these symptoms and is in atrial fibrillation with controlled heart rate today by ECG.    She has a history of atrial fibrillation ablation in July 2022 and also hypothyroidism with prior Graves' disease and thyroid storm status post RAI.  Synthroid continues to be adjusted, her last TSH was 28 in September 2023.  She remains on Eliquis for stroke prophylaxis, we discussed surveillance lab work.  No spontaneous bleeding problems reported.  Currently NYHA class I dyspnea, no fluid retention.  Her last echocardiogram in November 2022 revealed LVEF 65 to 70%.  Past Medical History:  Diagnosis Date   Chronic systolic dysfunction of left ventricle    GERD (gastroesophageal reflux disease)    Graves' disease with thyrotoxic storm    Status post RAI   Hypothyroidism    Persistent atrial fibrillation [facility])    Ablation July 2022    Current Outpatient Medications  Medication Sig Dispense Refill   acetaminophen (TYLENOL) 325 MG tablet Take 650 mg by mouth every 6 (six) hours as needed for moderate pain.     apixaban (ELIQUIS) 5 MG TABS tablet Take 1 tablet by mouth twice daily 180 tablet 1   ENTRESTO 24-26 MG Take 1 tablet by mouth twice daily 180 tablet 1   fluticasone (FLONASE) 50 MCG/ACT nasal spray Place 1-2 sprays into both nostrils daily as needed for allergies.     levothyroxine (SYNTHROID) 88 MCG tablet Take 1 tablet (88 mcg total) by mouth daily before  breakfast. 90 tablet 1   omeprazole (PRILOSEC) 40 MG capsule Take 40 mg by mouth in the morning.     metoprolol succinate (TOPROL XL) 25 MG 24 hr tablet Take 1 tablet (25 mg total) by mouth daily. 90 tablet 3   No current facility-administered medications for this visit.   Allergies:  Patient has no known allergies.   ROS: No orthopnea or PND.  Physical Exam: VS:  BP 118/80   Pulse 94   Ht '5\' 5"'$  (1.651 m)   Wt 172 lb 12.8 oz (78.4 kg)   SpO2 97%   BMI 28.76 kg/m , BMI Body mass index is 28.76 kg/m.  Wt Readings from Last 3 Encounters:  05/31/22 172 lb 12.8 oz (78.4 kg)  02/15/22 171 lb 3.2 oz (77.7 kg)  01/05/22 171 lb 14.4 oz (78 kg)    General: Patient appears comfortable at rest. HEENT: Conjunctiva and lids normal. Lungs: Clear to auscultation, nonlabored breathing at rest. Cardiac: Irregularly irregular without gallop or rub. Extremities: No pitting edema.  ECG:  An ECG dated 06/08/2021 was personally reviewed today and demonstrated:  Sinus bradycardia with decreased R wave progression.  Recent Labwork: 07/13/2021: TSH 31.05 09/07/2021: Hemoglobin 15.0; Platelets 243 August 2023: Hemoglobin 14.8, platelets 240 September 2023: TSH 28.66, free T4 0.71  Other Studies Reviewed Today:  Echocardiogram 03/28/2021:  1. Left ventricular ejection fraction, by estimation, is 65 to 70%. The  left ventricle has  normal function. The left ventricle has no regional  wall motion abnormalities. Left ventricular diastolic parameters were  normal.   2. Right ventricular systolic function is normal. The right ventricular  size is normal. Tricuspid regurgitation signal is inadequate for assessing  PA pressure.   3. Left atrial size was mildly dilated.   4. The mitral valve is grossly normal. Trivial mitral valve  regurgitation.   5. The aortic valve is tricuspid. Aortic valve regurgitation is trivial.   6. The inferior vena cava is normal in size with greater than 50%  respiratory  variability, suggesting right atrial pressure of 3 mmHg.   Assessment and Plan:  1.  Persistent atrial fibrillation with CHA2DS2-VASc score of 2.  She is status post atrial fibrillation ablation in July 2022 with Dr. Rayann Heman.  Remains on Toprol-XL and Eliquis.  She is in atrial fibrillation today with controlled heart rate.  States that she does have episodes around time of Synthroid adjustment.  Depending on rhythm frequency we may need to get her back in to the atrial fibrillation clinic to discuss antiarrhythmic therapy or touchup ablation, although stabilizing thyroid status in the interim will be just as important.  For now no dose adjustments were made.  2.  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  She remains on Toprol-XL and Entresto.  Plan follow-up echocardiogram for her next visit.  3.  History of Graves' disease and thyroid storm status post RAI and subsequent hypothyroidism.  She continues to undergo doses adjustments in Synthroid, last TSH was 28 in September 2023.  Medication Adjustments/Labs and Tests Ordered: Current medicines are reviewed at length with the patient today.  Concerns regarding medicines are outlined above.   Tests Ordered: Orders Placed This Encounter  Procedures   CBC   Basic metabolic panel   EKG [id number]   ECHOCARDIOGRAM COMPLETE    Medication Changes: Meds ordered this encounter  Medications   metoprolol succinate (TOPROL XL) 25 MG 24 hr tablet    Sig: Take 1 tablet (25 mg total) by mouth daily.    Dispense:  90 tablet    Refill:  3    Disposition:  Follow up  6 months.  Signed, Satira Sark, MD, [facility] 05/31/2022 10:00 AM    [place] [facility] [place], [place], [place] [address] Phone: [phone]; Fax: [phone]

## 2022-05-31 ENCOUNTER — Ambulatory Visit: Payer: Commercial Managed Care - PPO | Attending: Cardiology | Admitting: Cardiology

## 2022-05-31 ENCOUNTER — Encounter: Payer: Self-pay | Admitting: Cardiology

## 2022-05-31 VITALS — BP 118/80 | HR 94 | Ht 65.0 in | Wt 172.8 lb

## 2022-05-31 DIAGNOSIS — Z8679 Personal history of other diseases of the circulatory system: Secondary | ICD-10-CM | POA: Diagnosis not present

## 2022-05-31 DIAGNOSIS — Z79899 Other long term (current) drug therapy: Secondary | ICD-10-CM

## 2022-05-31 DIAGNOSIS — I4819 Other persistent atrial fibrillation: Secondary | ICD-10-CM

## 2022-05-31 MED ORDER — METOPROLOL SUCCINATE ER 25 MG PO TB24: 25.0000 mg | ORAL_TABLET | Freq: Every day | ORAL | 3 refills | Status: DC

## 2022-05-31 NOTE — Patient Instructions (Addendum)
Medication Instructions:  Your physician recommends that you continue on your current medications as directed. Please refer to the Current Medication list given to you today.  Labwork: BMET & CBC today at [facility]) Non-fasting  Testing/Procedures: Your physician has requested that you have an echocardiogram in 6 months just before your next visit (July 2024). Echocardiography is a painless test that uses sound waves to create images of your heart. It provides your doctor with information about the size and shape of your heart and how well your heart's chambers and valves are working. This procedure takes approximately one hour. There are no restrictions for this procedure. Please do NOT wear cologne, perfume, aftershave, or lotions (deodorant is allowed). Please arrive 15 minutes prior to your appointment time.  Follow-Up: Your physician recommends that you schedule a follow-up appointment in: 6 months  Any Other Special Instructions Will Be Listed Below (If Applicable).  If you need a refill on your cardiac medications before your next appointment, please call your pharmacy.

## 2022-06-02 ENCOUNTER — Encounter: Payer: Self-pay | Admitting: Cardiology

## 2022-06-02 NOTE — Telephone Encounter (Signed)
Patient informed and verbalized understanding of plan. Copy sent to PCP office

## 2022-06-02 NOTE — Telephone Encounter (Signed)
-----  Message from Satira Sark, MD sent at 06/01/2022  8:17 AM EST ----- Results reviewed.  Hemoglobin 15.6 and platelets 315.  Creatinine 1.52 and potassium 4.5.  Renal function has deteriorated since last check that I can see.  Please make sure that this lab work gets conveyed to Dr. Quintin Alto as he may have other lab work for comparison.  Could be that she is relatively volume contracted/dehydrated, would check on fluid status.  Also,  let's go ahead and get a follow-up echo now instead of waiting until the next visit to make sure that LVEF has not decreased.  Eliquis dose is still correct.

## 2022-06-06 ENCOUNTER — Other Ambulatory Visit: Payer: Self-pay | Admitting: Cardiology

## 2022-06-06 ENCOUNTER — Other Ambulatory Visit: Payer: Commercial Managed Care - PPO

## 2022-06-06 DIAGNOSIS — I428 Other cardiomyopathies: Secondary | ICD-10-CM

## 2022-06-06 DIAGNOSIS — Z79899 Other long term (current) drug therapy: Secondary | ICD-10-CM

## 2022-06-06 DIAGNOSIS — I4819 Other persistent atrial fibrillation: Secondary | ICD-10-CM

## 2022-06-06 DIAGNOSIS — Z8679 Personal history of other diseases of the circulatory system: Secondary | ICD-10-CM

## 2022-06-21 ENCOUNTER — Ambulatory Visit: Payer: Commercial Managed Care - PPO | Admitting: "Endocrinology

## 2022-06-22 ENCOUNTER — Encounter (HOSPITAL_COMMUNITY): Payer: Self-pay | Admitting: *Deleted

## 2022-06-25 ENCOUNTER — Other Ambulatory Visit: Payer: Self-pay | Admitting: Cardiology

## 2022-06-26 ENCOUNTER — Ambulatory Visit: Payer: Commercial Managed Care - PPO | Attending: Cardiology

## 2022-06-26 DIAGNOSIS — I4819 Other persistent atrial fibrillation: Secondary | ICD-10-CM

## 2022-06-26 DIAGNOSIS — I428 Other cardiomyopathies: Secondary | ICD-10-CM | POA: Diagnosis not present

## 2022-06-26 LAB — ECHOCARDIOGRAM COMPLETE
AR max vel: 2.24 cm2
AV Peak grad: 7.8 mmHg
Ao pk vel: 1.4 m/s
Area-P 1/2: 3.6 cm2
Calc EF: 63.1 %
MV M vel: 1.81 m/s
MV Peak grad: 13 mmHg
P 1/2 time: 5622 msec
S' Lateral: 3 cm
Single Plane A2C EF: 66.2 %
Single Plane A4C EF: 60.5 %

## 2022-07-10 ENCOUNTER — Ambulatory Visit (INDEPENDENT_AMBULATORY_CARE_PROVIDER_SITE_OTHER): Payer: Commercial Managed Care - PPO | Admitting: Gastroenterology

## 2022-08-04 ENCOUNTER — Ambulatory Visit: Payer: Commercial Managed Care - PPO | Admitting: "Endocrinology

## 2022-08-30 ENCOUNTER — Other Ambulatory Visit: Payer: Self-pay | Admitting: "Endocrinology

## 2022-08-30 DIAGNOSIS — E890 Postprocedural hypothyroidism: Secondary | ICD-10-CM

## 2022-08-31 LAB — TSH: TSH: 8.67 u[IU]/mL — ABNORMAL HIGH (ref 0.450–4.500)

## 2022-08-31 LAB — T4, FREE: Free T4: 1.28 ng/dL (ref 0.82–1.77)

## 2022-09-15 ENCOUNTER — Encounter: Payer: Self-pay | Admitting: "Endocrinology

## 2022-09-15 ENCOUNTER — Ambulatory Visit (INDEPENDENT_AMBULATORY_CARE_PROVIDER_SITE_OTHER): Payer: Commercial Managed Care - PPO | Admitting: "Endocrinology

## 2022-09-15 ENCOUNTER — Ambulatory Visit: Payer: Commercial Managed Care - PPO | Admitting: "Endocrinology

## 2022-09-15 VITALS — BP 104/76 | HR 52 | Ht 65.0 in | Wt 167.8 lb

## 2022-09-15 DIAGNOSIS — E890 Postprocedural hypothyroidism: Secondary | ICD-10-CM | POA: Diagnosis not present

## 2022-09-15 MED ORDER — LEVOTHYROXINE SODIUM 100 MCG PO TABS: 100.0000 ug | ORAL_TABLET | Freq: Every day | ORAL | 1 refills | Status: AC

## 2022-09-15 NOTE — Progress Notes (Unsigned)
09/15/2022            Endocrinology follow-up note   HPI  Bethany Blankenship is a 62 y.o.-year-old female, she is being seen in follow-up of RAI induced hypothyroidism.  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  Due to palpitation unlikely related to her thyroid problem, the dose of her levothyroxine was significantly decreased in the last few visits while she was undergoing work-up by her cardiologist.   Her previsit labs are improving, but still consistent with under replacement.  She is currently on levothyroxine 88 mcg p.o. daily before breakfast.     She denies palpitations at this time.  She reports fatigue and cold intolerance.  ROS: Limited as above. PE: BP 104/76   Pulse (!) 52   Ht 5\' 5"  (1.651 m)   Wt 167 lb 12.8 oz (76.1 kg)   BMI 27.92 kg/m  Wt Readings from Last 3 Encounters:  09/15/22 167 lb 12.8 oz (76.1 kg)  05/31/22 172 lb 12.8 oz (78.4 kg)  02/15/22 171 lb 3.2 oz (77.7 kg)    Physical Exam- Limited  Constitutional:  Body mass index is 27.92 kg/m. , not in acute distress, normal state of mind     Recent Results (from the past 2160 hour(s))  ECHOCARDIOGRAM COMPLETE     Status: None   Collection Time: 06/26/22  8:14 AM  Result Value Ref Range   S' Lateral 3.00 cm   Area-P 1/2 3.60 cm2   Single Plane A2C EF 66.2 %   Single Plane A4C EF 60.5 %   Calc EF 63.1 %   MV M vel 1.81 m/s   MV Peak grad 13.0 mmHg   AR max vel 2.24 cm2   P 1/2 time 5,622 msec   Ao pk vel 1.40 m/s   AV Peak grad 7.8 mmHg   Est EF 60 - 65%   TSH     Status: Abnormal   Collection Time: 08/30/22 10:44 AM  Result Value Ref Range   TSH 8.670 (H) 0.450 - 4.500 uIU/mL  T4, free     Status: None   Collection Time: 08/30/22 10:44 AM  Result Value Ref Range   Free T4 1.28 0.82 - 1.77 ng/dL     ASSESSMENT: 1. 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  Based on her  labs, she will need a higher dose of levothyroxine.  I discussed and increased her levothyroxine to 100 mcg p.o. daily before breakfast.     - We discussed about the correct intake of her thyroid hormone, on empty stomach at fasting, with water, separated by at least 30 minutes from breakfast and other medications,  and separated by more than 4 hours from calcium, iron, multivitamins, acid reflux medications (PPIs). -Patient is made aware of the fact that thyroid hormone replacement is needed for life, dose to be adjusted by periodic monitoring of thyroid function tests.  She is advised to maintain close follow-up with her PMD for primary care needs.    -Patient may benefit from screening bone density during her annual physical at her PCP office.   I spent  22  minutes in the care of the patient today including review of labs from Thyroid Function, CMP, and other relevant labs ; imaging/biopsy records (current and previous including abstractions from other facilities); face-to-face time discussing  her lab results and symptoms, medications doses, her options  of short and long term treatment based on the latest standards of care / guidelines;   and documenting the encounter.  Haskell Riling  participated in the discussions, expressed understanding, and voiced agreement with the above plans.  All questions were answered to her satisfaction. she is encouraged to contact clinic should she have any questions or concerns prior to her return visit.    Marquis Lunch, MD Phone: [phone]  Fax: [phone]  -  This note was partially dictated with voice recognition software. Similar sounding words can be transcribed inadequately or may not  be corrected upon review.  09/15/2022, 4:08 PM

## 2022-09-20 ENCOUNTER — Other Ambulatory Visit: Payer: Self-pay | Admitting: Cardiology

## 2022-09-20 NOTE — Telephone Encounter (Signed)
Prescription refill request for Eliquis received. Indication: AF Last office visit: 05/31/22  Ival Bible MD Scr: 1.52 on 05/31/22  Epic Age: 62 Weight: 78.4kg  Based on above findings Eliquis 5mg  twice daily is the appropriate dose.  Refill approved.

## 2022-09-23 ENCOUNTER — Encounter: Payer: Self-pay | Admitting: Cardiology

## 2022-09-25 ENCOUNTER — Encounter: Payer: Self-pay | Admitting: Cardiology

## 2022-09-25 NOTE — Telephone Encounter (Signed)
Request resolved, pt aware Eliquis fillable at her pharmacy.

## 2022-11-13 ENCOUNTER — Other Ambulatory Visit: Payer: Commercial Managed Care - PPO

## 2022-11-14 NOTE — Progress Notes (Unsigned)
    Cardiology Office Note  Date: 11/15/2022   ID: Bethany Blankenship, DOB Jan 31, 1961, MRN [id number]  History of Present Illness: Bethany Blankenship is a 62 y.o. female last seen in January.  She is here for a routine visit.  Reports no episodes of atrial fibrillation in the last 6 months.  No change in stamina or new exertional symptoms.  I reviewed her medications.  She reports no spontaneous bleeding problems on Eliquis and has otherwise been tolerating Toprol-XL and Entresto.  Follow-up echocardiogram in February revealed LVEF 60 to 65%.  She continues to follow with Dr. Neita Carp for routine lab work.  Physical Exam: VS:  BP 138/80   Pulse (!) 52   Ht 5\' 5"  (1.651 m)   Wt 168 lb (76.2 kg)   SpO2 98%   BMI 27.96 kg/m , BMI Body mass index is 27.96 kg/m.  Wt Readings from Last 3 Encounters:  11/15/22 168 lb (76.2 kg)  09/15/22 167 lb 12.8 oz (76.1 kg)  05/31/22 172 lb 12.8 oz (78.4 kg)    General: Patient appears comfortable at rest. HEENT: Conjunctiva and lids normal. Neck: Supple, no elevated JVP or carotid bruits. Lungs: Clear to auscultation, nonlabored breathing at rest. Cardiac: Regular rate and rhythm, no S3 or significant systolic murmur. Extremities: No pitting edema.  ECG:  An ECG dated 05/31/2022 was personally reviewed today and demonstrated atrial fibrillation with low voltage.  Labwork: 08/30/2022: TSH 8.670  January 2024: Hemoglobin 15.6, platelets 315, potassium 4.3, BUN 13, creatinine 1.52  Other Studies Reviewed Today:  Echocardiogram 06/26/2022:  1. Left ventricular ejection fraction, by estimation, is 60 to 65%. The  left ventricle has normal function. The left ventricle has no regional  wall motion abnormalities. Left ventricular diastolic parameters were  normal. The average left ventricular  global longitudinal strain is -24.3 %. The global longitudinal strain is  normal.   2. Right ventricular systolic function is low normal. The right  ventricular size  is normal.   3. The mitral valve is normal in structure. No evidence of mitral valve  regurgitation. No evidence of mitral stenosis.   4. The aortic valve is tricuspid. Aortic valve regurgitation is trivial.  No aortic stenosis is present.   5. The inferior vena cava is normal in size with greater than 50%  respiratory variability, suggesting right atrial pressure of 3 mmHg.   Assessment and Plan:  1.  Persistent atrial fibrillation with CHA2DS2-VASc score of 2.  She underwent atrial fibrillation ablation in July 2022.  Currently on Toprol-XL and Eliquis.  She is in sinus rhythm today on examination and asymptomatic over the last 6 months.  No changes made to present regimen, continue observation.  2.  History of nonischemic cardiomyopathy with improvement in LVEF, 60 to 65% by follow-up echocardiogram in February of this year.  Continue Toprol-XL and Entresto.  3.  History of Graves' disease and thyroid storm status post RAI and subsequent hypothyroidism.  Continuing to follow with Dr. Neita Carp.  Disposition:  Follow up  6 months.  Signed, Jonelle Sidle, M.D., F.A.C.C. [facility] at [facility]

## 2022-11-15 ENCOUNTER — Encounter: Payer: Self-pay | Admitting: Cardiology

## 2022-11-15 ENCOUNTER — Ambulatory Visit: Payer: Commercial Managed Care - PPO | Attending: Cardiology | Admitting: Cardiology

## 2022-11-15 VITALS — BP 138/80 | HR 52 | Ht 65.0 in | Wt 168.0 lb

## 2022-11-15 DIAGNOSIS — I4819 Other persistent atrial fibrillation: Secondary | ICD-10-CM | POA: Diagnosis not present

## 2022-11-15 DIAGNOSIS — Z8679 Personal history of other diseases of the circulatory system: Secondary | ICD-10-CM

## 2022-11-15 NOTE — Patient Instructions (Signed)

## 2023-03-23 ENCOUNTER — Ambulatory Visit: Payer: Commercial Managed Care - PPO | Admitting: "Endocrinology

## 2023-03-25 ENCOUNTER — Other Ambulatory Visit: Payer: Self-pay | Admitting: Cardiology

## 2023-03-26 NOTE — Telephone Encounter (Signed)
Prescription refill request for Eliquis received. Indication: AF Last office visit: 11/15/22  Ival Bible MD Scr: 1.11 on 12/13/22  KPN Age: 62 Weight: 76.2kg  Based on above findings Eliquis 5mg  twice daily is the appropriate dose.  Refill approved.

## 2023-05-11 ENCOUNTER — Ambulatory Visit: Payer: Commercial Managed Care - PPO | Attending: Nurse Practitioner | Admitting: Nurse Practitioner

## 2023-05-11 VITALS — BP 124/70 | HR 58 | Ht 65.0 in | Wt 168.8 lb

## 2023-05-11 DIAGNOSIS — I5032 Chronic diastolic (congestive) heart failure: Secondary | ICD-10-CM | POA: Diagnosis not present

## 2023-05-11 DIAGNOSIS — I480 Paroxysmal atrial fibrillation: Secondary | ICD-10-CM

## 2023-05-11 DIAGNOSIS — I428 Other cardiomyopathies: Secondary | ICD-10-CM | POA: Diagnosis not present

## 2023-05-11 NOTE — Progress Notes (Signed)
Cardiology Office Note:  .   Date:  05/11/2023  ID:  Bethany Blankenship, DOB 04-15-1961, MRN [id number] PCP: Estanislado Pandy, MD  [facility] Providers Cardiologist:  Nona Dell, MD    History of Present Illness: Bethany Blankenship is a 62 y.o. female with a PMH of persistent A-fib, history of nonischemic cardiomyopathy with improved EF, history of Graves' disease and thyroid storm, s/p RAI and subsequent hypothyroidism, who presents today for 37-month follow-up appointment.  Last seen by Dr. Diona Browner on November 15, 2022.  She was doing very well at the time.  Today she presents for 68-month follow-up appointment.  She states she is doing well.  1 month ago, she says she had a cold and was in A-fib for 2 weeks, says this has resolved and she has not had any more recurrences of A-fib. Denies any chest pain, shortness of breath, palpitations, syncope, presyncope, dizziness, orthopnea, PND, swelling or significant weight changes, acute bleeding, or claudication.  She says she has upcoming lab work with Dr. Neita Carp that has been scheduled.  ROS:.  Negative. See HPI.  Studies Reviewed: .    Echo 06/2022:  1. Left ventricular ejection fraction, by estimation, is 60 to 65%. The  left ventricle has normal function. The left ventricle has no regional  wall motion abnormalities. Left ventricular diastolic parameters were  normal. The average left ventricular  global longitudinal strain is -24.3 %. The global longitudinal strain is  normal.   2. Right ventricular systolic function is low normal. The right  ventricular size is normal.   3. The mitral valve is normal in structure. No evidence of mitral valve  regurgitation. No evidence of mitral stenosis.   4. The aortic valve is tricuspid. Aortic valve regurgitation is trivial.  No aortic stenosis is present.   5. The inferior vena cava is normal in size with greater than 50%  respiratory variability, suggesting right atrial pressure of 3 mmHg.    Comparison(s): No significant change from prior study.   Physical Exam:   VS:  BP 124/70 (BP Location: Left Arm, Cuff Size: Normal)   Pulse (!) 58   Ht 5\' 5"  (1.651 m)   Wt 168 lb 12.8 oz (76.6 kg)   SpO2 100%   BMI 28.09 kg/m    Wt Readings from Last 3 Encounters:  05/11/23 168 lb 12.8 oz (76.6 kg)  11/15/22 168 lb (76.2 kg)  09/15/22 167 lb 12.8 oz (76.1 kg)    GEN: Well nourished, well developed in no acute distress NECK: No JVD; No carotid bruits CARDIAC: S1/S2, RRR, no murmurs, rubs, gallops RESPIRATORY:  Clear to auscultation without rales, wheezing or rhonchi  ABDOMEN: Soft, non-tender, non-distended EXTREMITIES:  No edema; No deformity   ASSESSMENT AND PLAN: .    1.  PAF Did have an episode of A-fib that lasted 2 weeks 1 month ago due to an illness at the time.  This has resolved and she denies any recurrent palpitations/tachycardia.  She is in sinus rhythm on exam today.  Continue Toprol.  Continue Eliquis for stroke prevention.  She denies any bleeding issues.  Have requested that PCP will forward lab work to our office when this is available. Heart healthy diet and regular cardiovascular exercise encouraged.   2.  History of nonischemic cardiomyopathy with improved EF Stage C, NYHA class I symptoms.  EF 60 to 65%.  Euvolemic and well compensated on exam.  Continue Entresto and Toprol-XL. Low sodium diet,  fluid restriction <2L, and daily weights encouraged. Educated to contact our office for weight gain of 2 lbs overnight or 5 lbs in one week.    Dispo: Follow-up with Dr. Diona Browner or APP in 6 months or sooner if anything changes.  Signed, Sharlene Dory, NP

## 2023-05-11 NOTE — Patient Instructions (Signed)

## 2023-06-19 ENCOUNTER — Other Ambulatory Visit: Payer: Self-pay | Admitting: Cardiology

## 2023-06-21 ENCOUNTER — Other Ambulatory Visit: Payer: Self-pay | Admitting: Cardiology

## 2023-06-25 ENCOUNTER — Other Ambulatory Visit: Payer: Self-pay | Admitting: Cardiology

## 2023-06-25 MED ORDER — SACUBITRIL-VALSARTAN 24-26 MG PO TABS: 1.0000 | ORAL_TABLET | Freq: Two times a day (BID) | ORAL | 1 refills | Status: DC

## 2023-06-26 ENCOUNTER — Telehealth: Payer: Self-pay | Admitting: "Endocrinology

## 2023-06-26 DIAGNOSIS — E890 Postprocedural hypothyroidism: Secondary | ICD-10-CM

## 2023-06-26 NOTE — Telephone Encounter (Signed)
Labs need to be updated.

## 2023-06-26 NOTE — Telephone Encounter (Signed)
Orders updated and sent to [facility].

## 2023-06-27 ENCOUNTER — Ambulatory Visit: Payer: Commercial Managed Care - PPO | Admitting: "Endocrinology

## 2023-08-16 ENCOUNTER — Ambulatory Visit: Payer: Commercial Managed Care - PPO | Admitting: "Endocrinology

## 2023-08-17 ENCOUNTER — Ambulatory Visit: Payer: Commercial Managed Care - PPO | Admitting: "Endocrinology

## 2023-09-10 ENCOUNTER — Other Ambulatory Visit: Payer: Self-pay | Admitting: Cardiology

## 2023-09-10 NOTE — Telephone Encounter (Signed)
 Prescription refill request for Eliquis  received. Indication:afib Last office visit:12/24 Scr:1.1  7/24 Age: 63 Weight:76.6  kg  Prescription refilled

## 2023-09-23 ENCOUNTER — Other Ambulatory Visit: Payer: Self-pay | Admitting: Cardiology

## 2023-11-05 ENCOUNTER — Encounter: Payer: Self-pay | Admitting: Nurse Practitioner

## 2023-11-05 ENCOUNTER — Ambulatory Visit: Payer: Commercial Managed Care - PPO | Attending: Nurse Practitioner | Admitting: Nurse Practitioner

## 2023-11-05 VITALS — BP 125/70 | HR 49 | Ht 65.0 in | Wt 167.0 lb

## 2023-11-05 DIAGNOSIS — I480 Paroxysmal atrial fibrillation: Secondary | ICD-10-CM

## 2023-11-05 DIAGNOSIS — R001 Bradycardia, unspecified: Secondary | ICD-10-CM

## 2023-11-05 DIAGNOSIS — I5032 Chronic diastolic (congestive) heart failure: Secondary | ICD-10-CM | POA: Diagnosis not present

## 2023-11-05 DIAGNOSIS — I428 Other cardiomyopathies: Secondary | ICD-10-CM

## 2023-11-05 NOTE — Patient Instructions (Addendum)

## 2023-11-05 NOTE — Progress Notes (Signed)
 Cardiology Office Note:  .   Date:  11/05/2023 ID:  Bethany Blankenship Angle, DOB [birth year], [name] PCP: Atilano Deward ORN, MD  [facility] Providers Cardiologist:  Jayson Sierras, MD    History of Present Illness: Bethany Blankenship is a 63 y.o. female with a PMH of persistent A-fib, history of nonischemic cardiomyopathy with improved EF, history of Graves' disease and thyroid  storm, s/p RAI and subsequent hypothyroidism, who presents today for 60-month follow-up appointment.  Last seen by Dr. Sierras on November 15, 2022.  She was doing very well at the time.  05/10/2024 - Today she presents for 81-month follow-up appointment.  She states she is doing well.  1 month ago, she says she had a cold and was in A-fib for 2 weeks, says this has resolved and she has not had any more recurrences of A-fib. Denies any chest pain, shortness of breath, palpitations, syncope, presyncope, dizziness, orthopnea, PND, swelling or significant weight changes, acute bleeding, or claudication.  She says she has upcoming lab work with Dr. Atilano that has been scheduled.  11/05/2023 - Here for follow-up. Doing well. Says she has an episode of A-fib every 6 months, lasted for a week or so, says she was sick around that time. Tells me she take old prescription of Rhythmol PRN for episodes of A-fib and seems to help convert her out of A-fib. Denies any chest pain, shortness of breath, palpitations, syncope, presyncope, dizziness, orthopnea, PND, swelling or significant weight changes, acute bleeding, or claudication.  ROS:.  Negative. See HPI.  Studies Reviewed: SABRA    EKG:  EKG Interpretation Date/Time:  Monday November 05 2023 10:00:24 EDT Ventricular Rate:  49 PR Interval:  138 QRS Duration:  84 QT Interval:  434 QTC Calculation: 392 R Axis:   40  Text Interpretation: Sinus bradycardia Low voltage QRS Cannot rule out Anterior infarct (cited on or before 05-Jan-2021) When compared with ECG of 08-Jun-2021 15:55, No  significant change was found Confirmed by Miriam Norris [id number]) on 11/05/2023 10:04:18 AM   Echo 06/2022:  1. Left ventricular ejection fraction, by estimation, is 60 to 65%. The  left ventricle has normal function. The left ventricle has no regional  wall motion abnormalities. Left ventricular diastolic parameters were  normal. The average left ventricular  global longitudinal strain is -24.3 %. The global longitudinal strain is  normal.   2. Right ventricular systolic function is low normal. The right  ventricular size is normal.   3. The mitral valve is normal in structure. No evidence of mitral valve  regurgitation. No evidence of mitral stenosis.   4. The aortic valve is tricuspid. Aortic valve regurgitation is trivial.  No aortic stenosis is present.   5. The inferior vena cava is normal in size with greater than 50%  respiratory variability, suggesting right atrial pressure of 3 mmHg.   Comparison(s): No significant change from prior study.   Physical Exam:   VS:  BP 125/70 (BP Location: Right Arm, Cuff Size: Large)   Pulse (!) 49   Ht 5' 5 (1.651 m)   Wt 167 lb (75.8 kg)   SpO2 99%   BMI 27.79 kg/m    Wt Readings from Last 3 Encounters:  11/05/23 167 lb (75.8 kg)  05/11/23 168 lb 12.8 oz (76.6 kg)  11/15/22 168 lb (76.2 kg)    GEN: Well nourished, well developed in no acute distress NECK: No JVD; No carotid bruits CARDIAC: S1/S2, slow rate and regular,  no murmurs, rubs, gallops RESPIRATORY:  Clear to auscultation without rales, wheezing or rhonchi  ABDOMEN: Soft, non-tender, non-distended EXTREMITIES:  No edema; No deformity   ASSESSMENT AND PLAN: .    1.  PAF, bradycardia Has episodes of A-fib once every 6 months, takes Rythmol  PRN to help these episodes. I have asked her to verify her prescription bottle when she gets home and to call and update us  with this information. Will consult A-fib Clinic APP. This past episode resolved and she denies any recurrent  palpitations/tachycardia.  She is in SB on exam today, asymptomatic.  Continue Toprol .  Continue Eliquis  for stroke prevention.  She denies any bleeding issues.  Have requested that PCP will forward lab work to our office when this is available. Heart healthy diet and regular cardiovascular exercise encouraged.   2.  History of nonischemic cardiomyopathy with improved EF, NICM Stage C, NYHA class I symptoms.  EF 60 to 65%.  Euvolemic and well compensated on exam.  Continue Entresto  and Toprol -XL. Low sodium diet, fluid restriction <2L, and daily weights encouraged. Educated to contact our office for weight gain of 2 lbs overnight or 5 lbs in one week.    Dispo: Follow-up with Dr. Debera or APP in 6 months or sooner if anything changes.  Signed, Almarie Crate, NP

## 2023-11-08 ENCOUNTER — Ambulatory Visit: Payer: Commercial Managed Care - PPO | Admitting: Nurse Practitioner

## 2023-11-09 ENCOUNTER — Ambulatory Visit: Payer: Commercial Managed Care - PPO | Admitting: Nurse Practitioner

## 2023-11-10 ENCOUNTER — Ambulatory Visit: Payer: Self-pay | Admitting: Nurse Practitioner

## 2023-12-20 ENCOUNTER — Other Ambulatory Visit: Payer: Self-pay | Admitting: Cardiology

## 2024-03-18 ENCOUNTER — Other Ambulatory Visit: Payer: Self-pay | Admitting: Cardiology

## 2024-03-18 NOTE — Telephone Encounter (Signed)
 Prescription refill request for Eliquis  received. Indication:afib Last office visit:6/25 Scr:1.07  3/25 Age: 63 Weight:75.8  kg  Prescription refilled

## 2024-05-16 ENCOUNTER — Encounter: Payer: Self-pay | Admitting: Nurse Practitioner

## 2024-05-16 ENCOUNTER — Ambulatory Visit: Attending: Nurse Practitioner | Admitting: Nurse Practitioner

## 2024-05-16 VITALS — BP 134/80 | HR 54 | Ht 65.0 in | Wt 171.0 lb

## 2024-05-16 DIAGNOSIS — R001 Bradycardia, unspecified: Secondary | ICD-10-CM | POA: Diagnosis not present

## 2024-05-16 DIAGNOSIS — I428 Other cardiomyopathies: Secondary | ICD-10-CM | POA: Diagnosis not present

## 2024-05-16 DIAGNOSIS — I480 Paroxysmal atrial fibrillation: Secondary | ICD-10-CM

## 2024-05-16 DIAGNOSIS — I4819 Other persistent atrial fibrillation: Secondary | ICD-10-CM

## 2024-05-16 MED ORDER — PROPAFENONE HCL ER 225 MG PO CP12: 225.0000 mg | ORAL_CAPSULE | Freq: Two times a day (BID) | ORAL | 5 refills | Status: AC

## 2024-05-16 NOTE — Patient Instructions (Addendum)

## 2024-05-16 NOTE — Progress Notes (Signed)
 " Cardiology Office Note:  .   Date:  05/16/2024 ID:  Bethany Blankenship, DOB 11-Jun-1960, [name] PCP: Trudy Vaughn FALCON, MD  [facility] Providers Cardiologist:  Jayson Sierras, MD    History of Present Illness: Bethany Blankenship is a 64 y.o. female with a PMH of persistent A-fib, history of nonischemic cardiomyopathy with improved EF, history of Graves' disease and thyroid  storm, s/p RAI and subsequent hypothyroidism, who presents today for 69-month follow-up appointment.  Last seen by Dr. Sierras on November 15, 2022.  She was doing very well at the time.  11/05/2023 - Here for follow-up. Doing well. Says she has an episode of A-fib every 6 months, lasted for a week or so, says she was sick around that time. Tells me she take old prescription of Rhythmol PRN for episodes of A-fib and seems to help convert her out of A-fib. Denies any chest pain, shortness of breath, palpitations, syncope, presyncope, dizziness, orthopnea, PND, swelling or significant weight changes, acute bleeding, or claudication.  05/16/2024 - Here for 6 month follow-up. Says she did have an unusual episode of A-fib in August 2025, says she felt weird, and felt her arms and legs vibrating, and she took an extra dose of Rythmol  and Metoprolol  and her symptoms were resolved. Denies any recent chest pain, shortness of breath, palpitations, syncope, presyncope, dizziness, orthopnea, PND, swelling or significant weight changes, acute bleeding, or claudication.  ROS:.  Negative. See HPI.  Studies Reviewed: SABRA    EKG:  EKG Interpretation Date/Time:  Friday May 16 2024 10:29:07 EST Ventricular Rate:  51 PR Interval:  136 QRS Duration:  86 QT Interval:  426 QTC Calculation: 392 R Axis:   6  Text Interpretation: Sinus bradycardia Cannot rule out Anterior infarct (cited on or before 05-Jan-2021) When compared with ECG of 05-Nov-2023 10:00, No significant change was found Confirmed by Miriam Norris [phone]) on  05/16/2024 10:39:13 AM   Echo 06/2022:  1. Left ventricular ejection fraction, by estimation, is 60 to 65%. The  left ventricle has normal function. The left ventricle has no regional  wall motion abnormalities. Left ventricular diastolic parameters were  normal. The average left ventricular  global longitudinal strain is -24.3 %. The global longitudinal strain is  normal.   2. Right ventricular systolic function is low normal. The right  ventricular size is normal.   3. The mitral valve is normal in structure. No evidence of mitral valve  regurgitation. No evidence of mitral stenosis.   4. The aortic valve is tricuspid. Aortic valve regurgitation is trivial.  No aortic stenosis is present.   5. The inferior vena cava is normal in size with greater than 50%  respiratory variability, suggesting right atrial pressure of 3 mmHg.   Comparison(s): No significant change from prior study.   Physical Exam:   VS:  BP 134/80 (BP Location: Right Arm, Cuff Size: Normal)   Pulse (!) 54   Ht 5' 5 (1.651 m)   Wt 171 lb (77.6 kg)   SpO2 100%   BMI 28.46 kg/m    Wt Readings from Last 3 Encounters:  05/16/24 171 lb (77.6 kg)  11/05/23 167 lb (75.8 kg)  05/11/23 168 lb 12.8 oz (76.6 kg)    GEN: Well nourished, well developed in no acute distress NECK: No JVD; No carotid bruits CARDIAC: S1/S2, slow rate and regular, no murmurs, rubs, gallops RESPIRATORY:  Clear to auscultation without rales, wheezing or rhonchi  ABDOMEN: Soft, non-tender,  non-distended EXTREMITIES:  No edema; No deformity   ASSESSMENT AND PLAN: .    1.  PAF, bradycardia Had episode of A-fib last August, takes Rythmol  PRN to help these episodes. We have verified this Rx with A-fib APP. See chart. She is in SB on exam today, asymptomatic.  Continue Toprol .  Continue Eliquis  for stroke prevention.  She denies any bleeding issues. Heart healthy diet and regular cardiovascular exercise encouraged.   2.  History of nonischemic  cardiomyopathy with improved EF, NICM Stage C, NYHA class I symptoms.  EF 60 to 65%.  Euvolemic and well compensated on exam.  Continue Entresto  and Toprol -XL. Low sodium diet, fluid restriction <2L, and daily weights encouraged. Educated to contact our office for weight gain of 2 lbs overnight or 5 lbs in one week.   I spent a total duration of 30 minutes reviewing prior notes, reviewing outside records including  labs, EKG today, face-to-face counseling of medical condition, pathophysiology, evaluation, management, and documenting the findings in the note.     Dispo: Will provide Follow-up with Dr. Debera or APP in 6 months or sooner if anything changes.  Signed, Almarie Crate, NP   "

## 2024-06-13 ENCOUNTER — Other Ambulatory Visit: Payer: Self-pay | Admitting: Cardiology
